# Patient Record
Sex: Female | Born: 1935
Health system: Southern US, Community
[De-identification: ages and names within clinical notes are randomized; demographics above are authoritative.]

## PROBLEM LIST (undated history)

## (undated) DIAGNOSIS — I4891 Unspecified atrial fibrillation: Secondary | ICD-10-CM

## (undated) DIAGNOSIS — I509 Heart failure, unspecified: Secondary | ICD-10-CM

## (undated) DIAGNOSIS — I351 Nonrheumatic aortic (valve) insufficiency: Secondary | ICD-10-CM

## (undated) DIAGNOSIS — I447 Left bundle-branch block, unspecified: Secondary | ICD-10-CM

## (undated) HISTORY — PX: BIV ICD GENERTAOR CHANGE OUT: SHX5745

## (undated) HISTORY — PX: OTHER SURGICAL HISTORY: SHX169

## (undated) HISTORY — PX: ABDOMINAL HYSTERECTOMY: SHX81

## (undated) HISTORY — DX: Unspecified atrial fibrillation: I48.91

## (undated) HISTORY — DX: Nonrheumatic aortic (valve) insufficiency: I35.1

## (undated) HISTORY — PX: GALLBLADDER SURGERY: SHX652

## (undated) HISTORY — DX: Left bundle-branch block, unspecified: I44.7

## (undated) HISTORY — DX: Heart failure, unspecified: I50.9

---

## 2011-04-05 DIAGNOSIS — R55 Syncope and collapse: Secondary | ICD-10-CM | POA: Diagnosis not present

## 2011-04-05 DIAGNOSIS — Z95810 Presence of automatic (implantable) cardiac defibrillator: Secondary | ICD-10-CM | POA: Diagnosis not present

## 2011-04-05 DIAGNOSIS — I428 Other cardiomyopathies: Secondary | ICD-10-CM | POA: Diagnosis not present

## 2011-04-12 DIAGNOSIS — I2589 Other forms of chronic ischemic heart disease: Secondary | ICD-10-CM | POA: Diagnosis not present

## 2011-05-25 DIAGNOSIS — Z95810 Presence of automatic (implantable) cardiac defibrillator: Secondary | ICD-10-CM | POA: Diagnosis not present

## 2011-05-25 DIAGNOSIS — R55 Syncope and collapse: Secondary | ICD-10-CM | POA: Diagnosis not present

## 2011-05-25 DIAGNOSIS — I428 Other cardiomyopathies: Secondary | ICD-10-CM | POA: Diagnosis not present

## 2011-05-25 DIAGNOSIS — Z79899 Other long term (current) drug therapy: Secondary | ICD-10-CM | POA: Diagnosis not present

## 2011-05-25 DIAGNOSIS — I10 Essential (primary) hypertension: Secondary | ICD-10-CM | POA: Diagnosis not present

## 2011-07-30 DIAGNOSIS — H524 Presbyopia: Secondary | ICD-10-CM | POA: Diagnosis not present

## 2011-07-30 DIAGNOSIS — H2510 Age-related nuclear cataract, unspecified eye: Secondary | ICD-10-CM | POA: Diagnosis not present

## 2011-08-09 DIAGNOSIS — I2589 Other forms of chronic ischemic heart disease: Secondary | ICD-10-CM | POA: Diagnosis not present

## 2011-08-09 DIAGNOSIS — Z45018 Encounter for adjustment and management of other part of cardiac pacemaker: Secondary | ICD-10-CM | POA: Diagnosis not present

## 2011-08-17 DIAGNOSIS — Z862 Personal history of diseases of the blood and blood-forming organs and certain disorders involving the immune mechanism: Secondary | ICD-10-CM | POA: Diagnosis not present

## 2011-09-08 DIAGNOSIS — H526 Other disorders of refraction: Secondary | ICD-10-CM | POA: Diagnosis not present

## 2011-09-08 DIAGNOSIS — H269 Unspecified cataract: Secondary | ICD-10-CM | POA: Diagnosis not present

## 2011-09-08 DIAGNOSIS — H40009 Preglaucoma, unspecified, unspecified eye: Secondary | ICD-10-CM | POA: Diagnosis not present

## 2011-10-18 DIAGNOSIS — I359 Nonrheumatic aortic valve disorder, unspecified: Secondary | ICD-10-CM | POA: Diagnosis not present

## 2011-10-18 DIAGNOSIS — Z95810 Presence of automatic (implantable) cardiac defibrillator: Secondary | ICD-10-CM | POA: Diagnosis not present

## 2011-10-18 DIAGNOSIS — Z7901 Long term (current) use of anticoagulants: Secondary | ICD-10-CM | POA: Diagnosis not present

## 2011-10-18 DIAGNOSIS — I428 Other cardiomyopathies: Secondary | ICD-10-CM | POA: Diagnosis not present

## 2011-10-18 DIAGNOSIS — I059 Rheumatic mitral valve disease, unspecified: Secondary | ICD-10-CM | POA: Diagnosis not present

## 2011-10-18 DIAGNOSIS — I4949 Other premature depolarization: Secondary | ICD-10-CM | POA: Diagnosis not present

## 2011-10-18 DIAGNOSIS — I420 Dilated cardiomyopathy: Secondary | ICD-10-CM | POA: Insufficient documentation

## 2011-10-18 DIAGNOSIS — Z880 Allergy status to penicillin: Secondary | ICD-10-CM | POA: Diagnosis not present

## 2011-10-18 DIAGNOSIS — I447 Left bundle-branch block, unspecified: Secondary | ICD-10-CM | POA: Diagnosis not present

## 2011-10-18 DIAGNOSIS — Z888 Allergy status to other drugs, medicaments and biological substances status: Secondary | ICD-10-CM | POA: Diagnosis not present

## 2011-10-18 DIAGNOSIS — Z79899 Other long term (current) drug therapy: Secondary | ICD-10-CM | POA: Diagnosis not present

## 2011-11-15 DIAGNOSIS — H40009 Preglaucoma, unspecified, unspecified eye: Secondary | ICD-10-CM | POA: Diagnosis not present

## 2011-11-15 DIAGNOSIS — H269 Unspecified cataract: Secondary | ICD-10-CM | POA: Diagnosis not present

## 2011-11-15 DIAGNOSIS — B900 Sequelae of central nervous system tuberculosis: Secondary | ICD-10-CM | POA: Diagnosis not present

## 2011-11-15 DIAGNOSIS — H5210 Myopia, unspecified eye: Secondary | ICD-10-CM | POA: Diagnosis not present

## 2011-11-26 DIAGNOSIS — I4891 Unspecified atrial fibrillation: Secondary | ICD-10-CM | POA: Diagnosis not present

## 2011-11-26 DIAGNOSIS — Z23 Encounter for immunization: Secondary | ICD-10-CM | POA: Diagnosis not present

## 2011-12-08 DIAGNOSIS — H40009 Preglaucoma, unspecified, unspecified eye: Secondary | ICD-10-CM | POA: Diagnosis not present

## 2011-12-08 DIAGNOSIS — I6789 Other cerebrovascular disease: Secondary | ICD-10-CM | POA: Diagnosis not present

## 2011-12-08 DIAGNOSIS — H269 Unspecified cataract: Secondary | ICD-10-CM | POA: Diagnosis not present

## 2011-12-14 DIAGNOSIS — I2589 Other forms of chronic ischemic heart disease: Secondary | ICD-10-CM | POA: Diagnosis not present

## 2012-03-03 DIAGNOSIS — Z7901 Long term (current) use of anticoagulants: Secondary | ICD-10-CM | POA: Diagnosis not present

## 2012-06-27 DIAGNOSIS — I428 Other cardiomyopathies: Secondary | ICD-10-CM | POA: Diagnosis not present

## 2012-06-27 DIAGNOSIS — Z7901 Long term (current) use of anticoagulants: Secondary | ICD-10-CM | POA: Diagnosis not present

## 2012-07-21 DIAGNOSIS — H2510 Age-related nuclear cataract, unspecified eye: Secondary | ICD-10-CM | POA: Diagnosis not present

## 2012-08-09 DIAGNOSIS — Z862 Personal history of diseases of the blood and blood-forming organs and certain disorders involving the immune mechanism: Secondary | ICD-10-CM | POA: Diagnosis not present

## 2012-08-09 DIAGNOSIS — Z95810 Presence of automatic (implantable) cardiac defibrillator: Secondary | ICD-10-CM | POA: Diagnosis not present

## 2012-08-09 DIAGNOSIS — Z7901 Long term (current) use of anticoagulants: Secondary | ICD-10-CM | POA: Diagnosis not present

## 2012-08-28 DIAGNOSIS — I2589 Other forms of chronic ischemic heart disease: Secondary | ICD-10-CM | POA: Diagnosis not present

## 2012-08-28 DIAGNOSIS — Z95810 Presence of automatic (implantable) cardiac defibrillator: Secondary | ICD-10-CM | POA: Diagnosis not present

## 2012-10-16 DIAGNOSIS — I428 Other cardiomyopathies: Secondary | ICD-10-CM | POA: Diagnosis not present

## 2012-10-16 DIAGNOSIS — I447 Left bundle-branch block, unspecified: Secondary | ICD-10-CM | POA: Diagnosis not present

## 2012-10-24 DIAGNOSIS — I10 Essential (primary) hypertension: Secondary | ICD-10-CM | POA: Diagnosis not present

## 2012-10-24 DIAGNOSIS — I428 Other cardiomyopathies: Secondary | ICD-10-CM | POA: Diagnosis not present

## 2012-10-24 DIAGNOSIS — Z23 Encounter for immunization: Secondary | ICD-10-CM | POA: Diagnosis not present

## 2012-10-24 DIAGNOSIS — Z7901 Long term (current) use of anticoagulants: Secondary | ICD-10-CM | POA: Diagnosis not present

## 2012-10-24 DIAGNOSIS — G4700 Insomnia, unspecified: Secondary | ICD-10-CM | POA: Diagnosis not present

## 2013-01-23 DIAGNOSIS — Z862 Personal history of diseases of the blood and blood-forming organs and certain disorders involving the immune mechanism: Secondary | ICD-10-CM | POA: Diagnosis not present

## 2013-01-23 DIAGNOSIS — I428 Other cardiomyopathies: Secondary | ICD-10-CM | POA: Diagnosis not present

## 2013-01-23 DIAGNOSIS — Z7901 Long term (current) use of anticoagulants: Secondary | ICD-10-CM | POA: Diagnosis not present

## 2013-01-30 DIAGNOSIS — I509 Heart failure, unspecified: Secondary | ICD-10-CM | POA: Diagnosis not present

## 2013-01-30 DIAGNOSIS — I517 Cardiomegaly: Secondary | ICD-10-CM | POA: Diagnosis not present

## 2013-01-30 DIAGNOSIS — I700 Atherosclerosis of aorta: Secondary | ICD-10-CM | POA: Diagnosis not present

## 2013-01-30 DIAGNOSIS — I447 Left bundle-branch block, unspecified: Secondary | ICD-10-CM | POA: Diagnosis not present

## 2013-01-30 DIAGNOSIS — I428 Other cardiomyopathies: Secondary | ICD-10-CM | POA: Diagnosis not present

## 2013-01-30 DIAGNOSIS — I4891 Unspecified atrial fibrillation: Secondary | ICD-10-CM | POA: Diagnosis not present

## 2013-01-30 DIAGNOSIS — I2589 Other forms of chronic ischemic heart disease: Secondary | ICD-10-CM | POA: Diagnosis not present

## 2013-01-30 DIAGNOSIS — I079 Rheumatic tricuspid valve disease, unspecified: Secondary | ICD-10-CM | POA: Diagnosis not present

## 2013-01-30 DIAGNOSIS — I059 Rheumatic mitral valve disease, unspecified: Secondary | ICD-10-CM | POA: Diagnosis not present

## 2013-01-30 DIAGNOSIS — Z95810 Presence of automatic (implantable) cardiac defibrillator: Secondary | ICD-10-CM | POA: Diagnosis not present

## 2013-01-30 LAB — CBC AND DIFFERENTIAL
HEMOGLOBIN: 12.2 g/dL (ref 12.0–16.0)
PLATELETS: 224 10*3/uL (ref 150–399)
WBC: 6.9 10*3/mL

## 2013-01-30 LAB — HEPATIC FUNCTION PANEL
ALT: 10 U/L (ref 7–35)
AST: 14 U/L (ref 13–35)

## 2013-01-30 LAB — BASIC METABOLIC PANEL
CREATININE: 1.2 mg/dL — AB (ref 0.5–1.1)
GLUCOSE: 85 mg/dL
Potassium: 4.1 mmol/L (ref 3.4–5.3)
Sodium: 137 mmol/L (ref 137–147)

## 2013-01-30 LAB — TSH: TSH: 2.8 u[IU]/mL (ref 0.41–5.90)

## 2013-01-31 DIAGNOSIS — I517 Cardiomegaly: Secondary | ICD-10-CM | POA: Diagnosis not present

## 2013-01-31 DIAGNOSIS — I509 Heart failure, unspecified: Secondary | ICD-10-CM | POA: Diagnosis not present

## 2013-01-31 DIAGNOSIS — I428 Other cardiomyopathies: Secondary | ICD-10-CM | POA: Diagnosis not present

## 2013-01-31 DIAGNOSIS — I447 Left bundle-branch block, unspecified: Secondary | ICD-10-CM | POA: Diagnosis not present

## 2013-01-31 DIAGNOSIS — I059 Rheumatic mitral valve disease, unspecified: Secondary | ICD-10-CM | POA: Diagnosis not present

## 2013-01-31 DIAGNOSIS — I4891 Unspecified atrial fibrillation: Secondary | ICD-10-CM | POA: Diagnosis not present

## 2013-02-07 DIAGNOSIS — I4891 Unspecified atrial fibrillation: Secondary | ICD-10-CM | POA: Diagnosis not present

## 2013-03-27 DIAGNOSIS — I2589 Other forms of chronic ischemic heart disease: Secondary | ICD-10-CM | POA: Diagnosis not present

## 2013-03-27 DIAGNOSIS — Z95810 Presence of automatic (implantable) cardiac defibrillator: Secondary | ICD-10-CM | POA: Diagnosis not present

## 2013-03-28 DIAGNOSIS — I369 Nonrheumatic tricuspid valve disorder, unspecified: Secondary | ICD-10-CM | POA: Diagnosis not present

## 2013-03-28 DIAGNOSIS — I447 Left bundle-branch block, unspecified: Secondary | ICD-10-CM | POA: Diagnosis not present

## 2013-03-28 DIAGNOSIS — I509 Heart failure, unspecified: Secondary | ICD-10-CM | POA: Diagnosis not present

## 2013-03-28 DIAGNOSIS — Z888 Allergy status to other drugs, medicaments and biological substances status: Secondary | ICD-10-CM | POA: Diagnosis not present

## 2013-03-28 DIAGNOSIS — Z880 Allergy status to penicillin: Secondary | ICD-10-CM | POA: Diagnosis not present

## 2013-03-28 DIAGNOSIS — I4891 Unspecified atrial fibrillation: Secondary | ICD-10-CM | POA: Diagnosis not present

## 2013-03-28 DIAGNOSIS — I080 Rheumatic disorders of both mitral and aortic valves: Secondary | ICD-10-CM | POA: Diagnosis not present

## 2013-03-28 DIAGNOSIS — I4892 Unspecified atrial flutter: Secondary | ICD-10-CM | POA: Diagnosis not present

## 2013-03-28 DIAGNOSIS — I428 Other cardiomyopathies: Secondary | ICD-10-CM | POA: Diagnosis not present

## 2013-04-04 DIAGNOSIS — I447 Left bundle-branch block, unspecified: Secondary | ICD-10-CM | POA: Diagnosis not present

## 2013-04-04 DIAGNOSIS — I059 Rheumatic mitral valve disease, unspecified: Secondary | ICD-10-CM | POA: Diagnosis not present

## 2013-04-04 DIAGNOSIS — I4891 Unspecified atrial fibrillation: Secondary | ICD-10-CM | POA: Diagnosis not present

## 2013-04-04 DIAGNOSIS — I359 Nonrheumatic aortic valve disorder, unspecified: Secondary | ICD-10-CM | POA: Diagnosis not present

## 2013-04-04 DIAGNOSIS — Z95810 Presence of automatic (implantable) cardiac defibrillator: Secondary | ICD-10-CM | POA: Diagnosis not present

## 2013-04-04 DIAGNOSIS — I428 Other cardiomyopathies: Secondary | ICD-10-CM | POA: Diagnosis not present

## 2013-05-22 DIAGNOSIS — I059 Rheumatic mitral valve disease, unspecified: Secondary | ICD-10-CM | POA: Diagnosis not present

## 2013-05-22 DIAGNOSIS — I4891 Unspecified atrial fibrillation: Secondary | ICD-10-CM | POA: Diagnosis not present

## 2013-05-22 DIAGNOSIS — I428 Other cardiomyopathies: Secondary | ICD-10-CM | POA: Diagnosis not present

## 2013-05-22 DIAGNOSIS — Z9889 Other specified postprocedural states: Secondary | ICD-10-CM | POA: Diagnosis not present

## 2013-05-22 DIAGNOSIS — I359 Nonrheumatic aortic valve disorder, unspecified: Secondary | ICD-10-CM | POA: Diagnosis not present

## 2013-05-23 DIAGNOSIS — Z7901 Long term (current) use of anticoagulants: Secondary | ICD-10-CM | POA: Diagnosis not present

## 2013-05-23 DIAGNOSIS — Z79899 Other long term (current) drug therapy: Secondary | ICD-10-CM | POA: Diagnosis not present

## 2013-05-23 DIAGNOSIS — I4891 Unspecified atrial fibrillation: Secondary | ICD-10-CM | POA: Diagnosis not present

## 2013-05-23 DIAGNOSIS — I428 Other cardiomyopathies: Secondary | ICD-10-CM | POA: Diagnosis not present

## 2013-05-23 DIAGNOSIS — I10 Essential (primary) hypertension: Secondary | ICD-10-CM | POA: Diagnosis not present

## 2013-05-28 DIAGNOSIS — I4891 Unspecified atrial fibrillation: Secondary | ICD-10-CM | POA: Diagnosis not present

## 2013-05-28 DIAGNOSIS — I5022 Chronic systolic (congestive) heart failure: Secondary | ICD-10-CM | POA: Diagnosis not present

## 2013-05-28 DIAGNOSIS — I428 Other cardiomyopathies: Secondary | ICD-10-CM | POA: Diagnosis not present

## 2013-05-28 DIAGNOSIS — Z95810 Presence of automatic (implantable) cardiac defibrillator: Secondary | ICD-10-CM | POA: Diagnosis not present

## 2013-06-22 DIAGNOSIS — I4891 Unspecified atrial fibrillation: Secondary | ICD-10-CM | POA: Diagnosis not present

## 2013-06-22 DIAGNOSIS — I428 Other cardiomyopathies: Secondary | ICD-10-CM | POA: Diagnosis not present

## 2013-07-05 DIAGNOSIS — I2589 Other forms of chronic ischemic heart disease: Secondary | ICD-10-CM | POA: Diagnosis not present

## 2013-07-05 DIAGNOSIS — Z45018 Encounter for adjustment and management of other part of cardiac pacemaker: Secondary | ICD-10-CM | POA: Diagnosis not present

## 2013-07-05 DIAGNOSIS — Z95810 Presence of automatic (implantable) cardiac defibrillator: Secondary | ICD-10-CM | POA: Diagnosis not present

## 2013-09-14 DIAGNOSIS — H2510 Age-related nuclear cataract, unspecified eye: Secondary | ICD-10-CM | POA: Diagnosis not present

## 2013-09-14 DIAGNOSIS — H524 Presbyopia: Secondary | ICD-10-CM | POA: Diagnosis not present

## 2013-10-08 DIAGNOSIS — Z4509 Encounter for adjustment and management of other cardiac device: Secondary | ICD-10-CM | POA: Diagnosis not present

## 2013-10-08 DIAGNOSIS — Z95818 Presence of other cardiac implants and grafts: Secondary | ICD-10-CM | POA: Diagnosis not present

## 2013-10-08 DIAGNOSIS — Z4502 Encounter for adjustment and management of automatic implantable cardiac defibrillator: Secondary | ICD-10-CM | POA: Diagnosis not present

## 2013-10-08 DIAGNOSIS — I2589 Other forms of chronic ischemic heart disease: Secondary | ICD-10-CM | POA: Diagnosis not present

## 2013-10-08 DIAGNOSIS — Z95810 Presence of automatic (implantable) cardiac defibrillator: Secondary | ICD-10-CM | POA: Diagnosis not present

## 2013-11-12 DIAGNOSIS — Z95818 Presence of other cardiac implants and grafts: Secondary | ICD-10-CM | POA: Diagnosis not present

## 2013-11-12 DIAGNOSIS — I2589 Other forms of chronic ischemic heart disease: Secondary | ICD-10-CM | POA: Diagnosis not present

## 2013-11-12 DIAGNOSIS — Z95810 Presence of automatic (implantable) cardiac defibrillator: Secondary | ICD-10-CM | POA: Diagnosis not present

## 2013-11-27 DIAGNOSIS — Z95810 Presence of automatic (implantable) cardiac defibrillator: Secondary | ICD-10-CM | POA: Diagnosis not present

## 2013-11-27 DIAGNOSIS — I428 Other cardiomyopathies: Secondary | ICD-10-CM | POA: Diagnosis not present

## 2013-11-27 DIAGNOSIS — I059 Rheumatic mitral valve disease, unspecified: Secondary | ICD-10-CM | POA: Diagnosis not present

## 2013-11-27 DIAGNOSIS — I359 Nonrheumatic aortic valve disorder, unspecified: Secondary | ICD-10-CM | POA: Diagnosis not present

## 2013-12-06 DIAGNOSIS — Z23 Encounter for immunization: Secondary | ICD-10-CM | POA: Diagnosis not present

## 2014-01-03 DIAGNOSIS — I447 Left bundle-branch block, unspecified: Secondary | ICD-10-CM | POA: Diagnosis not present

## 2014-01-03 DIAGNOSIS — I420 Dilated cardiomyopathy: Secondary | ICD-10-CM | POA: Diagnosis not present

## 2014-01-03 DIAGNOSIS — I5022 Chronic systolic (congestive) heart failure: Secondary | ICD-10-CM | POA: Diagnosis not present

## 2014-01-03 DIAGNOSIS — I471 Supraventricular tachycardia: Secondary | ICD-10-CM | POA: Diagnosis not present

## 2014-01-03 DIAGNOSIS — Z7901 Long term (current) use of anticoagulants: Secondary | ICD-10-CM | POA: Diagnosis not present

## 2014-01-03 DIAGNOSIS — I480 Paroxysmal atrial fibrillation: Secondary | ICD-10-CM | POA: Diagnosis not present

## 2014-02-13 DIAGNOSIS — Z45018 Encounter for adjustment and management of other part of cardiac pacemaker: Secondary | ICD-10-CM | POA: Diagnosis not present

## 2014-02-13 DIAGNOSIS — Z95810 Presence of automatic (implantable) cardiac defibrillator: Secondary | ICD-10-CM | POA: Diagnosis not present

## 2014-02-13 DIAGNOSIS — Z95818 Presence of other cardiac implants and grafts: Secondary | ICD-10-CM | POA: Diagnosis not present

## 2014-02-13 DIAGNOSIS — I255 Ischemic cardiomyopathy: Secondary | ICD-10-CM | POA: Diagnosis not present

## 2014-05-15 DIAGNOSIS — I5022 Chronic systolic (congestive) heart failure: Secondary | ICD-10-CM | POA: Diagnosis not present

## 2014-05-15 DIAGNOSIS — Z95818 Presence of other cardiac implants and grafts: Secondary | ICD-10-CM | POA: Diagnosis not present

## 2014-05-15 DIAGNOSIS — Z95810 Presence of automatic (implantable) cardiac defibrillator: Secondary | ICD-10-CM | POA: Diagnosis not present

## 2014-05-28 DIAGNOSIS — I340 Nonrheumatic mitral (valve) insufficiency: Secondary | ICD-10-CM | POA: Diagnosis not present

## 2014-05-28 DIAGNOSIS — Z7901 Long term (current) use of anticoagulants: Secondary | ICD-10-CM | POA: Diagnosis not present

## 2014-05-28 DIAGNOSIS — I5022 Chronic systolic (congestive) heart failure: Secondary | ICD-10-CM | POA: Diagnosis not present

## 2014-05-28 DIAGNOSIS — I351 Nonrheumatic aortic (valve) insufficiency: Secondary | ICD-10-CM | POA: Diagnosis not present

## 2014-05-28 DIAGNOSIS — I420 Dilated cardiomyopathy: Secondary | ICD-10-CM | POA: Diagnosis not present

## 2014-06-05 ENCOUNTER — Encounter: Payer: Self-pay | Admitting: Family Medicine

## 2014-06-05 ENCOUNTER — Ambulatory Visit (INDEPENDENT_AMBULATORY_CARE_PROVIDER_SITE_OTHER): Payer: Medicare Other | Admitting: Family Medicine

## 2014-06-05 VITALS — BP 137/87 | HR 62 | Ht 66.0 in | Wt 173.0 lb

## 2014-06-05 DIAGNOSIS — Z780 Asymptomatic menopausal state: Secondary | ICD-10-CM

## 2014-06-05 DIAGNOSIS — I447 Left bundle-branch block, unspecified: Secondary | ICD-10-CM

## 2014-06-05 DIAGNOSIS — I351 Nonrheumatic aortic (valve) insufficiency: Secondary | ICD-10-CM

## 2014-06-05 DIAGNOSIS — I482 Chronic atrial fibrillation, unspecified: Secondary | ICD-10-CM

## 2014-06-05 DIAGNOSIS — I509 Heart failure, unspecified: Secondary | ICD-10-CM | POA: Diagnosis not present

## 2014-06-05 DIAGNOSIS — Z950 Presence of cardiac pacemaker: Secondary | ICD-10-CM

## 2014-06-05 DIAGNOSIS — I4891 Unspecified atrial fibrillation: Secondary | ICD-10-CM

## 2014-06-05 HISTORY — DX: Heart failure, unspecified: I50.9

## 2014-06-05 HISTORY — DX: Left bundle-branch block, unspecified: I44.7

## 2014-06-05 HISTORY — DX: Unspecified atrial fibrillation: I48.91

## 2014-06-05 HISTORY — DX: Nonrheumatic aortic (valve) insufficiency: I35.1

## 2014-06-05 NOTE — Progress Notes (Signed)
CC: Lindsey Reynolds is a 81 y.o. female is here for Establish Care   Subjective: HPI:  Very pleasant 81 year old here to establish care  She tells me she was diagnosed with atrial fibrillation a unknown amount of years ago. She's required cardioversion twice. She is currently taking carvediloland xarelto with any rapid heartbeat, irregular heartbeat nor bleeding or bruising abnormalities.  She has a history of left bundle branch block and congestive heart failure and currently has a 3-lead cardiac pacemaker/defibrillator that is managed by [facility]. She takes carvedilol and lisinopril. She was intolerant of lisinopril or carvedilol was at a dose of 12 mg twice a day however when this was decreased she was now able to tolerate 2.5 mg of lisinopril daily. She denies any new lightheadedness or orthostatic like symptoms.  She tells me that ever since menopause she has taken oral estrogen. She has had a hysterectomy for benign reasons. She denies any vaginal bleeding nor history of abnormal mammograms. She tells me she understands that there is an increased risk of breast cancer and ovarian cancer while taking estrogen but she is willing to accept this risk because the estrogen helps with quality of life in regards to energy, cognition and overall sense of well-being.  Review of Systems - General ROS: negative for - chills, fever, night sweats, weight gain or weight loss Ophthalmic ROS: negative for - decreased vision Psychological ROS: negative for - anxiety or depression ENT ROS: negative for - hearing change, nasal congestion, tinnitus or allergies Hematological and Lymphatic ROS: negative for - bleeding problems, bruising or swollen lymph nodes Breast ROS: negative Respiratory ROS: no cough, shortness of breath, or wheezing Cardiovascular ROS: no chest pain or dyspnea on exertion Gastrointestinal ROS: no abdominal pain, change in bowel habits, or black or bloody  stools Genito-Urinary ROS: negative for - genital discharge, genital ulcers, incontinence or abnormal bleeding from genitals Musculoskeletal ROS: negative for - joint pain or muscle pain Neurological ROS: negative for - headaches or memory loss Dermatological ROS: negative for lumps, mole changes, rash and skin lesion changes  Past Medical History  Diagnosis Date  . Atrial fibrillation 06/05/2014  . Congestive heart failure 06/05/2014  . LBBB (left bundle branch block) 06/05/2014  . Aortic valve regurgitation 06/05/2014    Past Surgical History  Procedure Laterality Date  . Abdominal hysterectomy     History reviewed. No pertinent family history.  History   Social History  . Marital Status: Married    Spouse Name: N/A  . Number of Children: N/A  . Years of Education: N/A   Occupational History  . Not on file.   Social History Main Topics  . Smoking status: Not on file  . Smokeless tobacco: Not on file  . Alcohol Use: Not on file  . Drug Use: Not on file  . Sexual Activity: Not on file   Other Topics Concern  . Not on file   Social History Narrative  . No narrative on file     Objective: BP 137/87 mmHg  Pulse 62  Ht 5\' 6"  (1.676 m)  Wt 173 lb (78.472 kg)  BMI 27.94 kg/m2  General: Alert and Oriented, No Acute Distress HEENT: Pupils equal, round, reactive to light. Conjunctivae clear.  Moist mucous membranes Lungs: Clear to auscultation bilaterally, no wheezing/ronchi/rales.  Comfortable work of breathing. Good air movement. Cardiac: Regular rate and rhythm. Normal S1/S2.  No murmurs, rubs, nor gallops.   Extremities: No peripheral edema.  Strong peripheral pulses.  Mental Status: No depression, anxiety, nor agitation. Skin: Warm and dry.  Assessment & Plan: Lindsey Reynolds was seen today for establish care.  Diagnoses and all orders for this visit:  Chronic atrial fibrillation  Aortic valve regurgitation  LBBB (left bundle branch block)  Congestive heart failure,  unspecified congestive heart failure chronicity, unspecified congestive heart failure type  Cardiac pacemaker in situ  Menopause   Chronic atrial fibrillation: Controlled, continue carvedilol and flecinide and xarelto Congestive heart failure: Controlled continue beta blocker and ACE inhibitor Menopause: Again she understands the risk that estrogen may increase the risk of certain cancers like breast cancer and ovarian cancer however she's willing to accept this risk, I would be willing to provide refills when she is needing this 3 times a week estrogen supplement.   Return in about 6 months (around 12/05/2014).

## 2014-06-12 DIAGNOSIS — I5022 Chronic systolic (congestive) heart failure: Secondary | ICD-10-CM | POA: Diagnosis not present

## 2014-06-13 ENCOUNTER — Encounter: Payer: Self-pay | Admitting: Family Medicine

## 2014-06-13 ENCOUNTER — Telehealth: Payer: Self-pay | Admitting: Family Medicine

## 2014-06-13 DIAGNOSIS — I340 Nonrheumatic mitral (valve) insufficiency: Secondary | ICD-10-CM | POA: Insufficient documentation

## 2014-06-13 NOTE — Telephone Encounter (Signed)
Care everywhere 

## 2014-06-14 ENCOUNTER — Encounter: Payer: Self-pay | Admitting: Family Medicine

## 2014-07-11 DIAGNOSIS — Z95818 Presence of other cardiac implants and grafts: Secondary | ICD-10-CM | POA: Diagnosis not present

## 2014-07-11 DIAGNOSIS — Z95810 Presence of automatic (implantable) cardiac defibrillator: Secondary | ICD-10-CM | POA: Diagnosis not present

## 2014-07-11 DIAGNOSIS — I255 Ischemic cardiomyopathy: Secondary | ICD-10-CM | POA: Diagnosis not present

## 2014-08-16 DIAGNOSIS — H25011 Cortical age-related cataract, right eye: Secondary | ICD-10-CM | POA: Diagnosis not present

## 2014-08-16 DIAGNOSIS — H2511 Age-related nuclear cataract, right eye: Secondary | ICD-10-CM | POA: Diagnosis not present

## 2014-08-16 DIAGNOSIS — H2512 Age-related nuclear cataract, left eye: Secondary | ICD-10-CM | POA: Diagnosis not present

## 2014-08-16 DIAGNOSIS — H25012 Cortical age-related cataract, left eye: Secondary | ICD-10-CM | POA: Diagnosis not present

## 2014-09-03 ENCOUNTER — Other Ambulatory Visit: Payer: Self-pay

## 2014-09-03 ENCOUNTER — Telehealth: Payer: Self-pay | Admitting: Family Medicine

## 2014-09-03 MED ORDER — ESTROGENS CONJUGATED 0.625 MG PO TABS: ORAL_TABLET | ORAL | Status: DC

## 2014-09-03 NOTE — Telephone Encounter (Signed)
Medication sent to pharmacy  

## 2014-09-03 NOTE — Telephone Encounter (Signed)
Patient needs refill sent to [facility] in [place] on [address] for Premarin 0.625 mg  thanks

## 2014-09-11 ENCOUNTER — Encounter: Payer: Self-pay | Admitting: Family Medicine

## 2014-09-11 ENCOUNTER — Ambulatory Visit (INDEPENDENT_AMBULATORY_CARE_PROVIDER_SITE_OTHER): Payer: Medicare Other | Admitting: Family Medicine

## 2014-09-11 VITALS — BP 159/91 | HR 75 | Wt 175.0 lb

## 2014-09-11 DIAGNOSIS — I482 Chronic atrial fibrillation, unspecified: Secondary | ICD-10-CM

## 2014-09-11 DIAGNOSIS — I10 Essential (primary) hypertension: Secondary | ICD-10-CM | POA: Diagnosis not present

## 2014-09-11 DIAGNOSIS — Z780 Asymptomatic menopausal state: Secondary | ICD-10-CM | POA: Diagnosis not present

## 2014-09-11 MED ORDER — CARVEDILOL 6.25 MG PO TABS: 6.2500 mg | ORAL_TABLET | Freq: Two times a day (BID) | ORAL | Status: DC

## 2014-09-11 MED ORDER — ESTROGENS CONJUGATED 0.625 MG PO TABS: ORAL_TABLET | ORAL | Status: DC

## 2014-09-11 NOTE — Progress Notes (Signed)
CC: Lindsey Reynolds is a 81 y.o. female is here for f/u estrogen   Subjective: HPI:  Follow-up menopause: She continues to take estrogen on a daily basis she tried to stop this in the past however has fatigue, depression, and overall severe decrease in quality of life. She would like to know if she can keep taking this medication.   Follow-up atrial fibrillation: She denies any irregular heartbeat, edema, shortness of breath or chest pain. She continues to take carvedilol and flecainide on a daily basis. She denies any fatigue.  Follow essential hypertension: No outside blood pressures to report. She tells me that any dose of lisinopril even when taking only 2.5 mg daily she was having symptoms about hypotension and close to falling. Ever since stopping this medication she's had no issues whatsoever but also that she is only taking half a dose of carvedilol twice a day. No motor or sensory disturbances since stopping lisinopril     Review Of Systems Outlined In HPI  Past Medical History  Diagnosis Date  . Atrial fibrillation 06/05/2014  . Congestive heart failure 06/05/2014  . LBBB (left bundle branch block) 06/05/2014  . Aortic valve regurgitation 06/05/2014    Past Surgical History  Procedure Laterality Date  . Abdominal hysterectomy    . Gallbladder surgery      20 years ago  . Cardoversion      x 2 2015  . Placement of defibrillator      2012  . Biv icd genertaor change out     No family history on file.  History   Social History  . Marital Status: Married    Spouse Name: N/A  . Number of Children: N/A  . Years of Education: N/A   Occupational History  . Not on file.   Social History Main Topics  . Smoking status: Never Smoker   . Smokeless tobacco: Not on file  . Alcohol Use: 0.0 oz/week    0 Standard drinks or equivalent per week  . Drug Use: No  . Sexual Activity:    Partners: Male   Other Topics Concern  . Not on file   Social History Narrative  . No  narrative on file     Objective: BP 159/91 mmHg  Pulse 75  Wt 175 lb (79.379 kg)  General: Alert and Oriented, No Acute Distress HEENT: Pupils equal, round, reactive to light. Conjunctivae clear.   Lungs: Clear to auscultation bilaterally, no wheezing/ronchi/rales.  Comfortable work of breathing. Good air movement. Cardiac: Regular rate and rhythm. Normal S1/S2.  No murmurs, rubs, nor gallops.   Extremities: No peripheral edema.  Strong peripheral pulses.  Mental Status: No depression, anxiety, nor agitation. Skin: Warm and dry.  Assessment & Plan: Lindsey Reynolds was seen today for f/u estrogen.  Diagnoses and all orders for this visit:  Chronic atrial fibrillation Orders: -     carvedilol (COREG) 6.25 MG tablet; Take 1 tablet (6.25 mg total) by mouth 2 (two) times daily with a meal.  Menopause Orders: -     estrogens, conjugated, (PREMARIN) 0.625 MG tablet; One by mouth daily  Essential hypertension  Other orders -     Discontinue: carvedilol (COREG) 6.25 MG tablet; Take 1 tablet (6.25 mg total) by mouth 2 (two) times daily with a meal.   Essential hypertension: Controlled, I've asked her to consider increasing her carvedilol to 6.25 mg twice a day since her blood pressure is uncontrolled today. However understand if she needs to cut back to  only half the dose daily if she gets orthostatic. She'll follow up with her cardiologist between now and 6 months follow-up. Menopause: Discussed that there is some risk of ovarian or breast cancer with taking estrogen on a daily basis however she believes that the benefits outweigh the risks. Atrial fibrillation: Controlled, in regular rhythm today, continue xarelto and flecainide.  Return in about 6 months (around 03/14/2015).

## 2014-10-10 DIAGNOSIS — H2511 Age-related nuclear cataract, right eye: Secondary | ICD-10-CM | POA: Diagnosis not present

## 2014-10-10 DIAGNOSIS — H25811 Combined forms of age-related cataract, right eye: Secondary | ICD-10-CM | POA: Diagnosis not present

## 2014-10-11 DIAGNOSIS — H2512 Age-related nuclear cataract, left eye: Secondary | ICD-10-CM | POA: Diagnosis not present

## 2014-10-15 DIAGNOSIS — I255 Ischemic cardiomyopathy: Secondary | ICD-10-CM | POA: Diagnosis not present

## 2014-10-15 DIAGNOSIS — Z95818 Presence of other cardiac implants and grafts: Secondary | ICD-10-CM | POA: Diagnosis not present

## 2014-10-15 DIAGNOSIS — Z95810 Presence of automatic (implantable) cardiac defibrillator: Secondary | ICD-10-CM | POA: Diagnosis not present

## 2014-10-31 DIAGNOSIS — H2512 Age-related nuclear cataract, left eye: Secondary | ICD-10-CM | POA: Diagnosis not present

## 2014-11-27 DIAGNOSIS — Z23 Encounter for immunization: Secondary | ICD-10-CM | POA: Diagnosis not present

## 2014-12-03 DIAGNOSIS — I351 Nonrheumatic aortic (valve) insufficiency: Secondary | ICD-10-CM | POA: Diagnosis not present

## 2014-12-03 DIAGNOSIS — K219 Gastro-esophageal reflux disease without esophagitis: Secondary | ICD-10-CM | POA: Diagnosis not present

## 2014-12-03 DIAGNOSIS — Z79899 Other long term (current) drug therapy: Secondary | ICD-10-CM | POA: Diagnosis not present

## 2014-12-03 DIAGNOSIS — I480 Paroxysmal atrial fibrillation: Secondary | ICD-10-CM | POA: Diagnosis not present

## 2014-12-03 DIAGNOSIS — I420 Dilated cardiomyopathy: Secondary | ICD-10-CM | POA: Diagnosis not present

## 2014-12-03 DIAGNOSIS — Z8679 Personal history of other diseases of the circulatory system: Secondary | ICD-10-CM | POA: Diagnosis not present

## 2014-12-03 DIAGNOSIS — Z95810 Presence of automatic (implantable) cardiac defibrillator: Secondary | ICD-10-CM | POA: Insufficient documentation

## 2014-12-03 DIAGNOSIS — Z7901 Long term (current) use of anticoagulants: Secondary | ICD-10-CM | POA: Diagnosis not present

## 2014-12-03 DIAGNOSIS — I447 Left bundle-branch block, unspecified: Secondary | ICD-10-CM | POA: Diagnosis not present

## 2014-12-03 DIAGNOSIS — I340 Nonrheumatic mitral (valve) insufficiency: Secondary | ICD-10-CM | POA: Diagnosis not present

## 2014-12-03 DIAGNOSIS — R0602 Shortness of breath: Secondary | ICD-10-CM | POA: Diagnosis not present

## 2014-12-03 DIAGNOSIS — R05 Cough: Secondary | ICD-10-CM | POA: Diagnosis not present

## 2014-12-03 DIAGNOSIS — I080 Rheumatic disorders of both mitral and aortic valves: Secondary | ICD-10-CM | POA: Diagnosis not present

## 2015-01-09 DIAGNOSIS — I447 Left bundle-branch block, unspecified: Secondary | ICD-10-CM | POA: Diagnosis not present

## 2015-01-09 DIAGNOSIS — Z95810 Presence of automatic (implantable) cardiac defibrillator: Secondary | ICD-10-CM | POA: Diagnosis not present

## 2015-01-09 DIAGNOSIS — Z7901 Long term (current) use of anticoagulants: Secondary | ICD-10-CM | POA: Diagnosis not present

## 2015-01-09 DIAGNOSIS — I471 Supraventricular tachycardia: Secondary | ICD-10-CM | POA: Diagnosis not present

## 2015-01-09 DIAGNOSIS — I480 Paroxysmal atrial fibrillation: Secondary | ICD-10-CM | POA: Diagnosis not present

## 2015-01-09 DIAGNOSIS — I420 Dilated cardiomyopathy: Secondary | ICD-10-CM | POA: Diagnosis not present

## 2015-01-09 DIAGNOSIS — Z79899 Other long term (current) drug therapy: Secondary | ICD-10-CM | POA: Diagnosis not present

## 2015-01-20 DIAGNOSIS — I255 Ischemic cardiomyopathy: Secondary | ICD-10-CM | POA: Diagnosis not present

## 2015-01-20 DIAGNOSIS — Z95818 Presence of other cardiac implants and grafts: Secondary | ICD-10-CM | POA: Diagnosis not present

## 2015-01-20 DIAGNOSIS — Z95810 Presence of automatic (implantable) cardiac defibrillator: Secondary | ICD-10-CM | POA: Diagnosis not present

## 2015-03-19 ENCOUNTER — Ambulatory Visit (INDEPENDENT_AMBULATORY_CARE_PROVIDER_SITE_OTHER): Payer: Medicare Other | Admitting: Family Medicine

## 2015-03-19 ENCOUNTER — Encounter: Payer: Self-pay | Admitting: Family Medicine

## 2015-03-19 VITALS — BP 169/94 | HR 64 | Wt 175.0 lb

## 2015-03-19 DIAGNOSIS — Z780 Asymptomatic menopausal state: Secondary | ICD-10-CM

## 2015-03-19 DIAGNOSIS — I482 Chronic atrial fibrillation, unspecified: Secondary | ICD-10-CM

## 2015-03-19 DIAGNOSIS — I10 Essential (primary) hypertension: Secondary | ICD-10-CM | POA: Diagnosis not present

## 2015-03-19 DIAGNOSIS — Z23 Encounter for immunization: Secondary | ICD-10-CM | POA: Diagnosis not present

## 2015-03-19 MED ORDER — ESTROGENS CONJUGATED 0.625 MG PO TABS: ORAL_TABLET | ORAL | Status: DC

## 2015-03-19 MED ORDER — CARVEDILOL 12.5 MG PO TABS: 12.5000 mg | ORAL_TABLET | Freq: Two times a day (BID) | ORAL | Status: DC

## 2015-03-19 MED ORDER — PNEUMOCOCCAL 13-VAL CONJ VACC IM SUSP: 0.5000 mL | INTRAMUSCULAR | Status: DC

## 2015-03-19 NOTE — Progress Notes (Signed)
CC: Lindsey Reynolds is a 82 y.o. female is here for Hypertension   Subjective: HPI:  Follow-up atrial fibrillation: Follow-up atrial fibrillation: Taking Xarelto daily without known side effects. Denies racing heartbeat or new motor or sensory disturbances.  She is requesting refills on estrogen. She had a hysterectomy for noncancerous reasons. When she stops this medication she feels extreme fatigue and depression. She denies any known side effects. No vaginal bleeding  Follow-up essential hypertension: No outside blood pressure report. Taking carvedilol without known side effects. No chest pain shortness of breath orthopnea nor peripheral edema.   Review Of Systems Outlined In HPI  Past Medical History  Diagnosis Date  . Atrial fibrillation (HCC) 06/05/2014  . Congestive heart failure (HCC) 06/05/2014  . LBBB (left bundle branch block) 06/05/2014  . Aortic valve regurgitation 06/05/2014    Past Surgical History  Procedure Laterality Date  . Abdominal hysterectomy    . Gallbladder surgery      20 years ago  . Cardoversion      x 2 2015  . Placement of defibrillator      2012  . Biv icd genertaor change out     No family history on file.  Social History   Social History  . Marital Status: Married    Spouse Name: N/A  . Number of Children: N/A  . Years of Education: N/A   Occupational History  . Not on file.   Social History Main Topics  . Smoking status: Never Smoker   . Smokeless tobacco: Not on file  . Alcohol Use: 0.0 oz/week    0 Standard drinks or equivalent per week  . Drug Use: No  . Sexual Activity:    Partners: Male   Other Topics Concern  . Not on file   Social History Narrative     Objective: BP 169/94 mmHg  Pulse 64  Wt 175 lb (79.379 kg)  General: Alert and Oriented, No Acute Distress HEENT: Pupils equal, round, reactive to light. Conjunctivae clear.  Moist mucous membranes Lungs: Clear to auscultation bilaterally, no wheezing/ronchi/rales.   Comfortable work of breathing. Good air movement. Cardiac: Regular rate and rhythm. Normal S1/S2.  No murmurs, rubs, nor gallops.   Extremities: No peripheral edema.  Strong peripheral pulses.  Mental Status: No depression, anxiety, nor agitation. Skin: Warm and dry.  Assessment & Plan: Adelie was seen today for hypertension.  Diagnoses and all orders for this visit:  Chronic atrial fibrillation (HCC)  Menopause -     estrogens, conjugated, (PREMARIN) 0.625 MG tablet; One by mouth daily  Essential hypertension  Need for pneumococcal vaccination -     pneumococcal 13-valent conjugate vaccine (PREVNAR 13) injection 0.5 mL; Inject 0.5 mLs into the muscle tomorrow at 10 am.  Other orders -     carvedilol (COREG) 12.5 MG tablet; Take 1 tablet (12.5 mg total) by mouth 2 (two) times daily with a meal.   Atrial fibrillation: She sounds to be in sinus rhythm right now, continue xarelto follow-up with cardiology asAlready planned. Essential hypertension: Uncontrolled chronic condition increasing carvedilol Menopause: Refills on estrogen.  She received the Prevnar vaccine today and already had pneumococcal vaccine when she was 67.   Return in about 3 months (around 06/17/2015) for Blood Pressure.

## 2015-03-19 NOTE — Addendum Note (Signed)
Addended by: Thom Chimes on: 03/19/2015 01:50 PM   Modules accepted: Orders

## 2015-04-07 DIAGNOSIS — I255 Ischemic cardiomyopathy: Secondary | ICD-10-CM | POA: Diagnosis not present

## 2015-04-07 DIAGNOSIS — Z95818 Presence of other cardiac implants and grafts: Secondary | ICD-10-CM | POA: Diagnosis not present

## 2015-04-07 DIAGNOSIS — I4892 Unspecified atrial flutter: Secondary | ICD-10-CM | POA: Diagnosis not present

## 2015-04-07 DIAGNOSIS — Z95810 Presence of automatic (implantable) cardiac defibrillator: Secondary | ICD-10-CM | POA: Diagnosis not present

## 2015-05-01 DIAGNOSIS — Z95810 Presence of automatic (implantable) cardiac defibrillator: Secondary | ICD-10-CM | POA: Diagnosis not present

## 2015-05-01 DIAGNOSIS — I4891 Unspecified atrial fibrillation: Secondary | ICD-10-CM | POA: Diagnosis not present

## 2015-05-01 DIAGNOSIS — Z7901 Long term (current) use of anticoagulants: Secondary | ICD-10-CM | POA: Diagnosis not present

## 2015-05-01 DIAGNOSIS — I480 Paroxysmal atrial fibrillation: Secondary | ICD-10-CM | POA: Diagnosis not present

## 2015-05-01 DIAGNOSIS — I447 Left bundle-branch block, unspecified: Secondary | ICD-10-CM | POA: Diagnosis not present

## 2015-05-01 DIAGNOSIS — I420 Dilated cardiomyopathy: Secondary | ICD-10-CM | POA: Diagnosis not present

## 2015-05-15 DIAGNOSIS — R948 Abnormal results of function studies of other organs and systems: Secondary | ICD-10-CM | POA: Diagnosis not present

## 2015-05-15 DIAGNOSIS — I083 Combined rheumatic disorders of mitral, aortic and tricuspid valves: Secondary | ICD-10-CM | POA: Diagnosis not present

## 2015-05-15 DIAGNOSIS — I480 Paroxysmal atrial fibrillation: Secondary | ICD-10-CM | POA: Diagnosis not present

## 2015-05-15 DIAGNOSIS — I272 Other secondary pulmonary hypertension: Secondary | ICD-10-CM | POA: Diagnosis not present

## 2015-06-17 DIAGNOSIS — R0609 Other forms of dyspnea: Secondary | ICD-10-CM | POA: Diagnosis not present

## 2015-06-17 DIAGNOSIS — I340 Nonrheumatic mitral (valve) insufficiency: Secondary | ICD-10-CM | POA: Diagnosis not present

## 2015-06-17 DIAGNOSIS — I10 Essential (primary) hypertension: Secondary | ICD-10-CM | POA: Diagnosis not present

## 2015-06-17 DIAGNOSIS — Z7901 Long term (current) use of anticoagulants: Secondary | ICD-10-CM | POA: Diagnosis not present

## 2015-06-17 DIAGNOSIS — R9439 Abnormal result of other cardiovascular function study: Secondary | ICD-10-CM | POA: Diagnosis not present

## 2015-06-17 DIAGNOSIS — Z95810 Presence of automatic (implantable) cardiac defibrillator: Secondary | ICD-10-CM | POA: Diagnosis not present

## 2015-06-17 DIAGNOSIS — I420 Dilated cardiomyopathy: Secondary | ICD-10-CM | POA: Diagnosis not present

## 2015-06-17 DIAGNOSIS — I480 Paroxysmal atrial fibrillation: Secondary | ICD-10-CM | POA: Diagnosis not present

## 2015-06-17 DIAGNOSIS — I351 Nonrheumatic aortic (valve) insufficiency: Secondary | ICD-10-CM | POA: Diagnosis not present

## 2015-06-19 ENCOUNTER — Encounter: Payer: Self-pay | Admitting: Family Medicine

## 2015-06-19 ENCOUNTER — Ambulatory Visit (INDEPENDENT_AMBULATORY_CARE_PROVIDER_SITE_OTHER): Payer: Medicare Other | Admitting: Family Medicine

## 2015-06-19 ENCOUNTER — Other Ambulatory Visit: Payer: Self-pay | Admitting: Family Medicine

## 2015-06-19 VITALS — BP 155/88 | HR 62 | Wt 175.0 lb

## 2015-06-19 DIAGNOSIS — I10 Essential (primary) hypertension: Secondary | ICD-10-CM | POA: Diagnosis not present

## 2015-06-19 DIAGNOSIS — I482 Chronic atrial fibrillation, unspecified: Secondary | ICD-10-CM

## 2015-06-19 DIAGNOSIS — I5022 Chronic systolic (congestive) heart failure: Secondary | ICD-10-CM | POA: Diagnosis not present

## 2015-06-19 MED ORDER — CARVEDILOL 25 MG PO TABS: 25.0000 mg | ORAL_TABLET | Freq: Two times a day (BID) | ORAL | Status: DC

## 2015-06-19 NOTE — Progress Notes (Signed)
CC: Lindsey Reynolds is a 82 y.o. female is here for Hypertension   Subjective: HPI: follow-up essential hypertension: She is taking 12.5 mg of Coreg twice a day. Blood pressures at home  Are consistently in the stage I or stage II hypertensive range. She denies chest pain shortness of breath orthopnea or peripheral edema at the present time. She had some peripheral edema earlier this week but it resolved with taking Lasix.   Follow-up atrial fibrillation: She states that her rhythm has been regular and there's been no  Irregularity to this ever since she was cardioverted in February. She's advised to get a CT scan  Of her chest to evaluate for coronary artery calcifications however she tells me that she does not want to ever get catheterization  Unlessn eeded emergently.   Review Of Systems Outlined In HPI  Past Medical History  Diagnosis Date  . Atrial fibrillation (HCC) 06/05/2014  . Congestive heart failure (HCC) 06/05/2014  . LBBB (left bundle branch block) 06/05/2014  . Aortic valve regurgitation 06/05/2014    Past Surgical History  Procedure Laterality Date  . Abdominal hysterectomy    . Gallbladder surgery      20 years ago  . Cardoversion      x 2 2015  . Placement of defibrillator      2012  . Biv icd genertaor change out     No family history on file.  Social History   Social History  . Marital Status: Married    Spouse Name: N/A  . Number of Children: N/A  . Years of Education: N/A   Occupational History  . Not on file.   Social History Main Topics  . Smoking status: Never Smoker   . Smokeless tobacco: Not on file  . Alcohol Use: 0.0 oz/week    0 Standard drinks or equivalent per week  . Drug Use: No  . Sexual Activity:    Partners: Male   Other Topics Concern  . Not on file   Social History Narrative     Objective: BP 155/88 mmHg  Pulse 62  Wt 175 lb (79.379 kg)  General: Alert and Oriented, No Acute Distress HEENT: Pupils equal, round, reactive  to light. Conjunctivae clear.   Lungs: Clear to auscultation bilaterally, no wheezing/ronchi/rales.  Comfortable work of breathing. Good air movement. Cardiac: Regular rate and rhythm. Normal S1/S2.  No murmurs, rubs, nor gallops.   Extremities: No peripheral edema.  Strong peripheral pulses.  Mental Status: No depression, anxiety, nor agitation. Skin: Warm and dry.  Assessment & Plan: Lindsey Reynolds was seen today for hypertension.  Diagnoses and all orders for this visit:  Chronic atrial fibrillation (HCC)  Essential hypertension -     carvedilol (COREG) 25 MG tablet; Take 1 tablet (25 mg total) by mouth 2 (two) times daily with a meal.  Chronic systolic heart failure (HCC) -     B Nat Peptide    atrial fibrillation: Currently rate controlled and in sinus rhythm. Properly anticoagulated.  Essential hypertension: Uncontrolled chronic condition increasing carvedilol  chronic systolic heart failure: Currently euvolemic will continue to take furosemide only on an as-needed basis. BNP was slightly elevated earlier this week, rechecking today.  Return in about 3 months (around 09/18/2015).

## 2015-06-20 LAB — BRAIN NATRIURETIC PEPTIDE: BRAIN NATRIURETIC PEPTIDE: 254.8 pg/mL — AB (ref <100)

## 2015-06-30 DIAGNOSIS — Z95818 Presence of other cardiac implants and grafts: Secondary | ICD-10-CM | POA: Diagnosis not present

## 2015-06-30 DIAGNOSIS — Z95810 Presence of automatic (implantable) cardiac defibrillator: Secondary | ICD-10-CM | POA: Diagnosis not present

## 2015-06-30 DIAGNOSIS — Z4502 Encounter for adjustment and management of automatic implantable cardiac defibrillator: Secondary | ICD-10-CM | POA: Diagnosis not present

## 2015-06-30 DIAGNOSIS — I255 Ischemic cardiomyopathy: Secondary | ICD-10-CM | POA: Diagnosis not present

## 2015-07-15 DIAGNOSIS — I255 Ischemic cardiomyopathy: Secondary | ICD-10-CM | POA: Diagnosis not present

## 2015-07-15 DIAGNOSIS — I4892 Unspecified atrial flutter: Secondary | ICD-10-CM | POA: Insufficient documentation

## 2015-07-15 DIAGNOSIS — Z7901 Long term (current) use of anticoagulants: Secondary | ICD-10-CM | POA: Diagnosis not present

## 2015-07-15 DIAGNOSIS — I481 Persistent atrial fibrillation: Secondary | ICD-10-CM | POA: Diagnosis not present

## 2015-07-15 DIAGNOSIS — Z95818 Presence of other cardiac implants and grafts: Secondary | ICD-10-CM | POA: Diagnosis not present

## 2015-07-15 DIAGNOSIS — Z95810 Presence of automatic (implantable) cardiac defibrillator: Secondary | ICD-10-CM | POA: Diagnosis not present

## 2015-07-31 DIAGNOSIS — I471 Supraventricular tachycardia: Secondary | ICD-10-CM | POA: Insufficient documentation

## 2015-08-05 DIAGNOSIS — Z95818 Presence of other cardiac implants and grafts: Secondary | ICD-10-CM | POA: Diagnosis not present

## 2015-08-05 DIAGNOSIS — I255 Ischemic cardiomyopathy: Secondary | ICD-10-CM | POA: Diagnosis not present

## 2015-08-05 DIAGNOSIS — Z95810 Presence of automatic (implantable) cardiac defibrillator: Secondary | ICD-10-CM | POA: Diagnosis not present

## 2015-08-05 DIAGNOSIS — I4892 Unspecified atrial flutter: Secondary | ICD-10-CM | POA: Diagnosis not present

## 2015-08-14 DIAGNOSIS — Z95810 Presence of automatic (implantable) cardiac defibrillator: Secondary | ICD-10-CM | POA: Diagnosis not present

## 2015-08-14 DIAGNOSIS — I480 Paroxysmal atrial fibrillation: Secondary | ICD-10-CM | POA: Diagnosis not present

## 2015-08-14 DIAGNOSIS — I4892 Unspecified atrial flutter: Secondary | ICD-10-CM | POA: Diagnosis not present

## 2015-08-14 DIAGNOSIS — I420 Dilated cardiomyopathy: Secondary | ICD-10-CM | POA: Diagnosis not present

## 2015-08-15 DIAGNOSIS — I480 Paroxysmal atrial fibrillation: Secondary | ICD-10-CM | POA: Diagnosis not present

## 2015-08-15 DIAGNOSIS — I4892 Unspecified atrial flutter: Secondary | ICD-10-CM | POA: Diagnosis not present

## 2015-08-18 DIAGNOSIS — Z7982 Long term (current) use of aspirin: Secondary | ICD-10-CM | POA: Diagnosis not present

## 2015-08-18 DIAGNOSIS — I420 Dilated cardiomyopathy: Secondary | ICD-10-CM | POA: Diagnosis not present

## 2015-08-18 DIAGNOSIS — I484 Atypical atrial flutter: Secondary | ICD-10-CM | POA: Diagnosis not present

## 2015-08-18 DIAGNOSIS — Z7901 Long term (current) use of anticoagulants: Secondary | ICD-10-CM | POA: Diagnosis not present

## 2015-08-18 DIAGNOSIS — I480 Paroxysmal atrial fibrillation: Secondary | ICD-10-CM | POA: Diagnosis not present

## 2015-08-19 DIAGNOSIS — Z9889 Other specified postprocedural states: Secondary | ICD-10-CM | POA: Diagnosis not present

## 2015-08-19 DIAGNOSIS — I4891 Unspecified atrial fibrillation: Secondary | ICD-10-CM | POA: Diagnosis not present

## 2015-08-19 DIAGNOSIS — Z8679 Personal history of other diseases of the circulatory system: Secondary | ICD-10-CM | POA: Insufficient documentation

## 2015-08-29 ENCOUNTER — Other Ambulatory Visit: Payer: Self-pay | Admitting: Family Medicine

## 2015-09-15 DIAGNOSIS — R531 Weakness: Secondary | ICD-10-CM | POA: Diagnosis not present

## 2015-09-15 DIAGNOSIS — I480 Paroxysmal atrial fibrillation: Secondary | ICD-10-CM | POA: Diagnosis not present

## 2015-09-15 DIAGNOSIS — I482 Chronic atrial fibrillation: Secondary | ICD-10-CM | POA: Diagnosis not present

## 2015-09-15 DIAGNOSIS — I509 Heart failure, unspecified: Secondary | ICD-10-CM | POA: Diagnosis not present

## 2015-09-15 DIAGNOSIS — I4891 Unspecified atrial fibrillation: Secondary | ICD-10-CM | POA: Diagnosis not present

## 2015-09-15 DIAGNOSIS — I110 Hypertensive heart disease with heart failure: Secondary | ICD-10-CM | POA: Diagnosis not present

## 2015-09-15 DIAGNOSIS — I447 Left bundle-branch block, unspecified: Secondary | ICD-10-CM | POA: Diagnosis not present

## 2015-09-15 DIAGNOSIS — Z7901 Long term (current) use of anticoagulants: Secondary | ICD-10-CM | POA: Diagnosis not present

## 2015-09-15 DIAGNOSIS — I272 Other secondary pulmonary hypertension: Secondary | ICD-10-CM | POA: Diagnosis not present

## 2015-09-15 DIAGNOSIS — Z95810 Presence of automatic (implantable) cardiac defibrillator: Secondary | ICD-10-CM | POA: Diagnosis not present

## 2015-09-15 DIAGNOSIS — I4892 Unspecified atrial flutter: Secondary | ICD-10-CM | POA: Diagnosis not present

## 2015-09-15 DIAGNOSIS — Z9889 Other specified postprocedural states: Secondary | ICD-10-CM | POA: Diagnosis not present

## 2015-09-15 DIAGNOSIS — R002 Palpitations: Secondary | ICD-10-CM | POA: Diagnosis not present

## 2015-09-16 ENCOUNTER — Ambulatory Visit (INDEPENDENT_AMBULATORY_CARE_PROVIDER_SITE_OTHER): Payer: Medicare Other | Admitting: Family Medicine

## 2015-09-16 ENCOUNTER — Encounter: Payer: Self-pay | Admitting: Family Medicine

## 2015-09-16 VITALS — BP 114/77 | HR 72

## 2015-09-16 DIAGNOSIS — I482 Chronic atrial fibrillation, unspecified: Secondary | ICD-10-CM

## 2015-09-16 DIAGNOSIS — R5383 Other fatigue: Secondary | ICD-10-CM

## 2015-09-16 NOTE — Progress Notes (Signed)
CC: Lindsey Reynolds is a 82 y.o. female is here for No chief complaint on file.   Subjective: HPI:  Ever since a cardiac ablation last month patient reports excessive fatigue and reduced strength and reduced endurance. The only thing that's changed since this time was starting on a new antiarrhythmic called propafenone. She gives me example of not being able to stand for more than 10 minutes without having to go sit down and rest for a few minutes. Additionally she feels that she can't lift much more than a gallon of milk. She denies any shortness of breath. She had some lightheadedness however this improved after she got a cardioversion yesterday. She denies any new motor or sensory sources other than that described above. She wants know if she should do with all the options that her cardiologist is layering out her.    Review Of Systems Outlined In HPI  Past Medical History  Diagnosis Date  . Atrial fibrillation (HCC) 06/05/2014  . Congestive heart failure (HCC) 06/05/2014  . LBBB (left bundle branch block) 06/05/2014  . Aortic valve regurgitation 06/05/2014    Past Surgical History  Procedure Laterality Date  . Abdominal hysterectomy    . Gallbladder surgery      20 years ago  . Cardoversion      x 2 2015  . Placement of defibrillator      2012  . Biv icd genertaor change out     No family history on file.  Social History   Social History  . Marital Status: Married    Spouse Name: N/A  . Number of Children: N/A  . Years of Education: N/A   Occupational History  . Not on file.   Social History Main Topics  . Smoking status: Never Smoker   . Smokeless tobacco: Not on file  . Alcohol Use: 0.0 oz/week    0 Standard drinks or equivalent per week  . Drug Use: No  . Sexual Activity:    Partners: Male   Other Topics Concern  . Not on file   Social History Narrative     Objective: BP 114/77 mmHg  Pulse 72  General: Alert and Oriented, No Acute Distress HEENT:  Pupils equal, round, reactive to light. Conjunctivae clear.  Moist mucous membranes pharynx unremarkable Lungs: Clear to auscultation bilaterally, no wheezing/ronchi/rales.  Comfortable work of breathing. Good air movement. Cardiac: Regular rate and rhythm. Normal S1/S2.  No murmurs, rubs, nor gallops.   Extremities: No peripheral edema.  Strong peripheral pulses.  Mental Status: No depression, anxiety, nor agitation. Skin: Warm and dry.  Assessment & Plan: Diagnoses and all orders for this visit:  Chronic atrial fibrillation (HCC) -     B12 -     Comprehensive Metabolic Panel (CMET) -     VITAMIN D 25 Hydroxy (Vit-D Deficiency, Fractures) -     CBC  Other fatigue   Atrial fibrillation with fatigue: Rule out B12 and vitamin D deficiency. Rule out anemia and metabolic abnormality. I discussed my suspicion that some of her symptoms could be caused by the propafenone. I've asked her to discuss possibility of changing this or if this is the only antiarrhythmic that she is a candidate for. I also let them know that he wants to take their electrophysiologic questions to their cardiology team if all the above labs are normal.  25 minutes spent face-to-face during visit today of which at least 50% was counseling or coordinating care regarding: 1. Chronic atrial fibrillation (HCC)  2. Other fatigue     Discussed with this patient that I will be resigning from my position here with [facility] in September in order to stay with my family who will be moving to [facility]. I let him know about the providers that are still accepting patients and I feel that this individual will be under great care if he/she stays here with [facility].   No Follow-up on file.

## 2015-09-17 LAB — VITAMIN D 25 HYDROXY (VIT D DEFICIENCY, FRACTURES): Vit D, 25-Hydroxy: 43 ng/mL (ref 30–100)

## 2015-09-17 LAB — COMPREHENSIVE METABOLIC PANEL
ALK PHOS: 172 U/L — AB (ref 33–130)
ALT: 41 U/L — AB (ref 6–29)
AST: 41 U/L — AB (ref 10–35)
Albumin: 3.5 g/dL — ABNORMAL LOW (ref 3.6–5.1)
BILIRUBIN TOTAL: 0.4 mg/dL (ref 0.2–1.2)
BUN: 15 mg/dL (ref 7–25)
CALCIUM: 8.6 mg/dL (ref 8.6–10.4)
CO2: 19 mmol/L — AB (ref 20–31)
CREATININE: 1.23 mg/dL — AB (ref 0.60–0.88)
Chloride: 105 mmol/L (ref 98–110)
GLUCOSE: 83 mg/dL (ref 65–99)
Potassium: 3.4 mmol/L — ABNORMAL LOW (ref 3.5–5.3)
SODIUM: 138 mmol/L (ref 135–146)
Total Protein: 6.2 g/dL (ref 6.1–8.1)

## 2015-09-17 LAB — CBC
HEMATOCRIT: 33.7 % — AB (ref 35.0–45.0)
Hemoglobin: 11.2 g/dL — ABNORMAL LOW (ref 11.7–15.5)
MCH: 29.9 pg (ref 27.0–33.0)
MCHC: 33.2 g/dL (ref 32.0–36.0)
MCV: 89.9 fL (ref 80.0–100.0)
MPV: 8.6 fL (ref 7.5–12.5)
Platelets: 228 10*3/uL (ref 140–400)
RBC: 3.75 MIL/uL — ABNORMAL LOW (ref 3.80–5.10)
RDW: 14 % (ref 11.0–15.0)
WBC: 5.8 10*3/uL (ref 3.8–10.8)

## 2015-09-17 LAB — VITAMIN B12: VITAMIN B 12: 341 pg/mL (ref 200–1100)

## 2015-10-29 ENCOUNTER — Other Ambulatory Visit: Payer: Self-pay

## 2015-11-06 DIAGNOSIS — Z8679 Personal history of other diseases of the circulatory system: Secondary | ICD-10-CM | POA: Diagnosis not present

## 2015-11-06 DIAGNOSIS — I447 Left bundle-branch block, unspecified: Secondary | ICD-10-CM | POA: Diagnosis not present

## 2015-11-06 DIAGNOSIS — I480 Paroxysmal atrial fibrillation: Secondary | ICD-10-CM | POA: Diagnosis not present

## 2015-11-06 DIAGNOSIS — Z9889 Other specified postprocedural states: Secondary | ICD-10-CM | POA: Diagnosis not present

## 2015-11-06 DIAGNOSIS — I420 Dilated cardiomyopathy: Secondary | ICD-10-CM | POA: Diagnosis not present

## 2015-11-06 DIAGNOSIS — I483 Typical atrial flutter: Secondary | ICD-10-CM | POA: Diagnosis not present

## 2015-11-06 DIAGNOSIS — Z7901 Long term (current) use of anticoagulants: Secondary | ICD-10-CM | POA: Diagnosis not present

## 2015-11-06 DIAGNOSIS — Z95810 Presence of automatic (implantable) cardiac defibrillator: Secondary | ICD-10-CM | POA: Diagnosis not present

## 2015-11-06 DIAGNOSIS — Z7982 Long term (current) use of aspirin: Secondary | ICD-10-CM | POA: Diagnosis not present

## 2015-11-07 DIAGNOSIS — I480 Paroxysmal atrial fibrillation: Secondary | ICD-10-CM | POA: Diagnosis not present

## 2015-11-07 DIAGNOSIS — I5022 Chronic systolic (congestive) heart failure: Secondary | ICD-10-CM | POA: Diagnosis not present

## 2015-11-07 DIAGNOSIS — I420 Dilated cardiomyopathy: Secondary | ICD-10-CM | POA: Diagnosis not present

## 2015-11-07 DIAGNOSIS — I471 Supraventricular tachycardia: Secondary | ICD-10-CM | POA: Diagnosis not present

## 2015-11-07 DIAGNOSIS — Z950 Presence of cardiac pacemaker: Secondary | ICD-10-CM | POA: Diagnosis not present

## 2015-11-07 DIAGNOSIS — I483 Typical atrial flutter: Secondary | ICD-10-CM | POA: Diagnosis not present

## 2015-11-11 DIAGNOSIS — Z8679 Personal history of other diseases of the circulatory system: Secondary | ICD-10-CM | POA: Diagnosis not present

## 2015-11-11 DIAGNOSIS — I480 Paroxysmal atrial fibrillation: Secondary | ICD-10-CM | POA: Diagnosis not present

## 2015-11-11 DIAGNOSIS — I420 Dilated cardiomyopathy: Secondary | ICD-10-CM | POA: Diagnosis not present

## 2015-11-11 DIAGNOSIS — Z9889 Other specified postprocedural states: Secondary | ICD-10-CM | POA: Diagnosis not present

## 2015-11-11 DIAGNOSIS — Z7982 Long term (current) use of aspirin: Secondary | ICD-10-CM | POA: Diagnosis not present

## 2015-11-11 DIAGNOSIS — Z7901 Long term (current) use of anticoagulants: Secondary | ICD-10-CM | POA: Diagnosis not present

## 2015-11-11 DIAGNOSIS — Z95810 Presence of automatic (implantable) cardiac defibrillator: Secondary | ICD-10-CM | POA: Diagnosis not present

## 2015-11-11 DIAGNOSIS — I340 Nonrheumatic mitral (valve) insufficiency: Secondary | ICD-10-CM | POA: Diagnosis not present

## 2015-11-11 DIAGNOSIS — I10 Essential (primary) hypertension: Secondary | ICD-10-CM | POA: Diagnosis not present

## 2015-11-11 DIAGNOSIS — I351 Nonrheumatic aortic (valve) insufficiency: Secondary | ICD-10-CM | POA: Diagnosis not present

## 2015-11-13 DIAGNOSIS — Z23 Encounter for immunization: Secondary | ICD-10-CM | POA: Diagnosis not present

## 2015-12-31 ENCOUNTER — Other Ambulatory Visit: Payer: Self-pay | Admitting: Family Medicine

## 2015-12-31 ENCOUNTER — Ambulatory Visit (INDEPENDENT_AMBULATORY_CARE_PROVIDER_SITE_OTHER): Payer: Medicare Other | Admitting: Family Medicine

## 2015-12-31 VITALS — BP 146/90 | HR 81 | Wt 174.0 lb

## 2015-12-31 DIAGNOSIS — Z8679 Personal history of other diseases of the circulatory system: Secondary | ICD-10-CM

## 2015-12-31 DIAGNOSIS — I340 Nonrheumatic mitral (valve) insufficiency: Secondary | ICD-10-CM

## 2015-12-31 DIAGNOSIS — I509 Heart failure, unspecified: Secondary | ICD-10-CM

## 2015-12-31 DIAGNOSIS — I482 Chronic atrial fibrillation, unspecified: Secondary | ICD-10-CM

## 2015-12-31 DIAGNOSIS — I10 Essential (primary) hypertension: Secondary | ICD-10-CM

## 2015-12-31 DIAGNOSIS — E559 Vitamin D deficiency, unspecified: Secondary | ICD-10-CM | POA: Insufficient documentation

## 2015-12-31 DIAGNOSIS — I420 Dilated cardiomyopathy: Secondary | ICD-10-CM

## 2015-12-31 DIAGNOSIS — I4892 Unspecified atrial flutter: Secondary | ICD-10-CM

## 2015-12-31 DIAGNOSIS — Z95810 Presence of automatic (implantable) cardiac defibrillator: Secondary | ICD-10-CM

## 2015-12-31 DIAGNOSIS — I351 Nonrheumatic aortic (valve) insufficiency: Secondary | ICD-10-CM

## 2015-12-31 DIAGNOSIS — Z9889 Other specified postprocedural states: Secondary | ICD-10-CM

## 2015-12-31 DIAGNOSIS — I471 Supraventricular tachycardia: Secondary | ICD-10-CM

## 2015-12-31 MED ORDER — CARVEDILOL 12.5 MG PO TABS: 12.5000 mg | ORAL_TABLET | Freq: Two times a day (BID) | ORAL | 1 refills | Status: DC

## 2015-12-31 NOTE — Progress Notes (Signed)
Lindsey Reynolds is a 82 y.o. female who presents to [facility]: Primary Care Sports Medicine today for establish care and follow-up cardiac disease, vitamin D deficiency, and anticoagulation.  Patient was previously seen by Dr. Ivan Reynolds who has since left the practice. This is her first visit with me.  Cardiac disease: Patient's major medical problems center around her heart. Fundamentally she has a true fibrillation and heart failure and valvular heart disease. Her arrhythmia is managed with Coreg and Propafenone as well as a Production assistant, radio. She sees cardiology at [facility] for this. She is feeling reasonably well but does note activity limiting shortness of breath and fatigue.    Vitamin D deficiency: Currently taking vitamin D daily. She notes several years ago she had a bone density test that was excellent.  Hormone replacement therapy: Patient is menopausal and uses estrogen for hormone replacement. She's been on this medication for quite a while now. She is very resistant to stopping. She is status post hysterectomy. She understands her risks for breast cancer and DVT.  Anticoagulation: As part of her heart disease she takes Xarelto daily for prophylaxis. She denies any bleeding.   Past Medical History:  Diagnosis Date  . Aortic valve regurgitation 06/05/2014  . Atrial fibrillation (HCC) 06/05/2014  . Congestive heart failure (HCC) 06/05/2014  . LBBB (left bundle branch block) 06/05/2014   Past Surgical History:  Procedure Laterality Date  . ABDOMINAL HYSTERECTOMY    . BIV ICD GENERTAOR CHANGE OUT    . cardoversion     x 2 2015  . GALLBLADDER SURGERY     20 years ago  . placement of defibrillator     2012   Social History  Substance Use Topics  . Smoking status: Never Smoker  . Smokeless tobacco: Not on file  . Alcohol use 0.0 oz/week   family history is not on file.  ROS as  above:  Medications: Current Outpatient Prescriptions  Medication Sig Dispense Refill  . aspirin EC 81 MG tablet Take 81 mg by mouth.    . cholecalciferol (VITAMIN D) 1000 UNITS tablet One by mouth daily    . estrogens, conjugated, (PREMARIN) 0.625 MG tablet One by mouth daily 90 tablet 2  . FLUZONE HIGH-DOSE 0.5 ML SUSY ADM 0.5ML IM UTD  0  . furosemide (LASIX) 20 MG tablet Take 20 mg by mouth.    . propafenone (RYTHMOL SR) 225 MG 12 hr capsule Take 225 mg by mouth.    . Rivaroxaban (XARELTO) 15 MG TABS tablet Take 1 tablet (15 mg total) by mouth daily with supper. 30 tablet   . carvedilol (COREG) 12.5 MG tablet Take 1 tablet (12.5 mg total) by mouth 2 (two) times daily with a meal. 180 tablet 1   No current facility-administered medications for this visit.    Allergies  Allergen Reactions  . Amiodarone Other (See Comments)    Blurred Vision  . Atorvastatin Other (See Comments)    cardiovascular arrest  . Lisinopril     hypotension  . Rosuvastatin Other (See Comments)    cardiovascular arrest  . Statins   . Penicillins Rash and Swelling    Health Maintenance Health Maintenance  Topic Date Due  . DEXA SCAN  04/01/2016 (Originally 01/10/1999)  . TETANUS/TDAP  10/30/2016 (Originally 01/09/1951)  . ZOSTAVAX  10/30/2028 (Originally 01/09/1994)  . PNA vac Low Risk Adult (2 of 2 - PPSV23) 03/18/2016  . INFLUENZA VACCINE  Completed  Exam:  BP (!) 146/90   Pulse 81   Wt 174 lb (78.9 kg)   BMI 28.08 kg/m  Gen: Well NAD HEENT: EOMI,  MMM Lungs: Normal work of breathing. CTABL Heart: RRR no MRG Abd: NABS, Soft. Nondistended, Nontender Exts: Brisk capillary refill, warm and well perfused. No edema  Notes and imaging and laboratory studies reviewed through care everywhere   No results found for this or any previous visit (from the past 72 hour(s)). No results found.     Assessment and Plan: 82 y.o. female with  Heart disease: Currently pretty well managed. Since  that she is doing better than she has been in many years. I don't plan on changing anything however would like to obtain fasting labs in the near future. Patient will return in a few months for wellness visit. Otherwise continue current medications.  Vitamin D deficiency: Doing well. Check vitamin D level.  Hormone replacement therapy: Discussed pros and cons. Continue estrogen.  Anticoagulation: Continue Xarelto.   Orders Placed This Encounter  Procedures  . CBC  . COMPLETE METABOLIC PANEL WITH GFR  . VITAMIN D 25 Hydroxy (Vit-D Deficiency, Fractures)  . Lipid panel    Discussed warning signs or symptoms. Please see discharge instructions. Patient expresses understanding.  I spent 40 minutes with this patient, greater than 50% was face-to-face time counseling regarding the above diagnosis.

## 2015-12-31 NOTE — Patient Instructions (Signed)
Thank you for coming in today. Continue current medicines.  Return in January.  Return sooner if needed.   Get fasting labs before the next visit.

## 2016-02-10 DIAGNOSIS — Z95810 Presence of automatic (implantable) cardiac defibrillator: Secondary | ICD-10-CM | POA: Diagnosis not present

## 2016-02-10 DIAGNOSIS — I420 Dilated cardiomyopathy: Secondary | ICD-10-CM | POA: Diagnosis not present

## 2016-02-10 DIAGNOSIS — I5022 Chronic systolic (congestive) heart failure: Secondary | ICD-10-CM | POA: Diagnosis not present

## 2016-02-10 DIAGNOSIS — Z95818 Presence of other cardiac implants and grafts: Secondary | ICD-10-CM | POA: Diagnosis not present

## 2016-02-10 DIAGNOSIS — I255 Ischemic cardiomyopathy: Secondary | ICD-10-CM | POA: Diagnosis not present

## 2016-03-04 DIAGNOSIS — I509 Heart failure, unspecified: Secondary | ICD-10-CM | POA: Diagnosis not present

## 2016-03-04 DIAGNOSIS — I482 Chronic atrial fibrillation: Secondary | ICD-10-CM | POA: Diagnosis not present

## 2016-03-04 DIAGNOSIS — I10 Essential (primary) hypertension: Secondary | ICD-10-CM | POA: Diagnosis not present

## 2016-03-04 DIAGNOSIS — E559 Vitamin D deficiency, unspecified: Secondary | ICD-10-CM | POA: Diagnosis not present

## 2016-03-04 LAB — COMPLETE METABOLIC PANEL WITH GFR
ALBUMIN: 3.6 g/dL (ref 3.6–5.1)
ALK PHOS: 49 U/L (ref 33–130)
ALT: 6 U/L (ref 6–29)
AST: 12 U/L (ref 10–35)
BUN: 16 mg/dL (ref 7–25)
CALCIUM: 8.7 mg/dL (ref 8.6–10.4)
CO2: 23 mmol/L (ref 20–31)
CREATININE: 1.2 mg/dL — AB (ref 0.60–0.88)
Chloride: 103 mmol/L (ref 98–110)
GFR, EST AFRICAN AMERICAN: 49 mL/min — AB (ref >=60)
GFR, Est Non African American: 42 mL/min — ABNORMAL LOW (ref >=60)
Glucose, Bld: 90 mg/dL (ref 65–99)
POTASSIUM: 4.1 mmol/L (ref 3.5–5.3)
Sodium: 138 mmol/L (ref 135–146)
Total Bilirubin: 0.5 mg/dL (ref 0.2–1.2)
Total Protein: 6.1 g/dL (ref 6.1–8.1)

## 2016-03-04 LAB — CBC
HEMATOCRIT: 36.7 % (ref 35.0–45.0)
HEMOGLOBIN: 12.2 g/dL (ref 11.7–15.5)
MCH: 30 pg (ref 27.0–33.0)
MCHC: 33.2 g/dL (ref 32.0–36.0)
MCV: 90.4 fL (ref 80.0–100.0)
MPV: 8.6 fL (ref 7.5–12.5)
Platelets: 215 10*3/uL (ref 140–400)
RBC: 4.06 MIL/uL (ref 3.80–5.10)
RDW: 13.8 % (ref 11.0–15.0)
WBC: 6.1 10*3/uL (ref 3.8–10.8)

## 2016-03-04 LAB — LIPID PANEL
Cholesterol: 231 mg/dL — ABNORMAL HIGH (ref <200)
HDL: 77 mg/dL (ref >50)
LDL Cholesterol: 122 mg/dL — ABNORMAL HIGH (ref <100)
Total CHOL/HDL Ratio: 3 Ratio (ref <5.0)
Triglycerides: 161 mg/dL — ABNORMAL HIGH (ref <150)
VLDL: 32 mg/dL — ABNORMAL HIGH (ref <30)

## 2016-03-05 LAB — VITAMIN D 25 HYDROXY (VIT D DEFICIENCY, FRACTURES): VIT D 25 HYDROXY: 45 ng/mL (ref 30–100)

## 2016-03-10 ENCOUNTER — Encounter: Payer: Self-pay | Admitting: Family Medicine

## 2016-03-10 ENCOUNTER — Ambulatory Visit (INDEPENDENT_AMBULATORY_CARE_PROVIDER_SITE_OTHER): Payer: Medicare Other | Admitting: Family Medicine

## 2016-03-10 VITALS — BP 101/70 | HR 89 | Wt 171.0 lb

## 2016-03-10 DIAGNOSIS — I482 Chronic atrial fibrillation, unspecified: Secondary | ICD-10-CM

## 2016-03-10 DIAGNOSIS — I4892 Unspecified atrial flutter: Secondary | ICD-10-CM

## 2016-03-10 DIAGNOSIS — I471 Supraventricular tachycardia: Secondary | ICD-10-CM

## 2016-03-10 DIAGNOSIS — Z780 Asymptomatic menopausal state: Secondary | ICD-10-CM | POA: Diagnosis not present

## 2016-03-10 DIAGNOSIS — Z95810 Presence of automatic (implantable) cardiac defibrillator: Secondary | ICD-10-CM

## 2016-03-10 DIAGNOSIS — I420 Dilated cardiomyopathy: Secondary | ICD-10-CM

## 2016-03-10 DIAGNOSIS — I10 Essential (primary) hypertension: Secondary | ICD-10-CM

## 2016-03-10 DIAGNOSIS — I509 Heart failure, unspecified: Secondary | ICD-10-CM

## 2016-03-10 MED ORDER — ESTROGENS CONJUGATED 0.625 MG PO TABS: ORAL_TABLET | ORAL | 2 refills | Status: DC

## 2016-03-10 NOTE — Patient Instructions (Signed)
Thank you for coming in today. Continue current medicines.  Recheck in 6 months.  Return sooner if needed.

## 2016-03-10 NOTE — Progress Notes (Signed)
Lindsey Reynolds is a 83 y.o. female who presents to [name] today for follow-up heart failure. Patient has significant heart failure complicated by atrial fibrillation. This is well managed by cardiology. In the interim patient has decreased her carvedilol to 6.25 twice daily. She noted and she has over the last few months than she hasn't last year. She notes she continues to have significant limitation in her activity. She has trouble with shortness of breath with even moderate levels of activity. This is stable however.   Past Medical History:  Diagnosis Date  . Aortic valve regurgitation 06/05/2014  . Atrial fibrillation (HCC) 06/05/2014  . Congestive heart failure (HCC) 06/05/2014  . LBBB (left bundle branch block) 06/05/2014   Past Surgical History:  Procedure Laterality Date  . ABDOMINAL HYSTERECTOMY    . BIV ICD GENERTAOR CHANGE OUT    . cardoversion     x 2 2015  . GALLBLADDER SURGERY     20 years ago  . placement of defibrillator     2012   Social History  Substance Use Topics  . Smoking status: Never Smoker  . Smokeless tobacco: Not on file  . Alcohol use 0.0 oz/week   family history is not on file.  ROS as above:  Medications: Current Outpatient Prescriptions  Medication Sig Dispense Refill  . aspirin EC 81 MG tablet Take 81 mg by mouth.    . carvedilol (COREG) 12.5 MG tablet Take 1 tablet (12.5 mg total) by mouth 2 (two) times daily with a meal. (Patient taking differently: Take 6.25 mg by mouth 2 (two) times daily with a meal. ) 180 tablet 1  . cholecalciferol (VITAMIN D) 1000 UNITS tablet One by mouth daily    . estrogens, conjugated, (PREMARIN) 0.625 MG tablet One by mouth daily 90 tablet 2  . furosemide (LASIX) 20 MG tablet Take 20 mg by mouth.    . propafenone (RYTHMOL SR) 225 MG 12 hr capsule Take 225 mg by mouth.    . Rivaroxaban  (XARELTO) 15 MG TABS tablet Take 1 tablet (15 mg total) by mouth daily with supper. 30 tablet    No current facility-administered medications for this visit.    Allergies  Allergen Reactions  . Amiodarone Other (See Comments)    Blurred Vision  . Atorvastatin Other (See Comments)    cardiovascular arrest  . Lisinopril     hypotension  . Rosuvastatin Other (See Comments)    cardiovascular arrest  . Statins   . Penicillins Rash and Swelling    Health Maintenance Health Maintenance  Topic Date Due  . DEXA SCAN  04/01/2016 (Originally 01/10/1999)  . TETANUS/TDAP  10/30/2016 (Originally 01/09/1951)  . ZOSTAVAX  10/30/2028 (Originally 01/09/1994)  . PNA vac Low Risk Adult (2 of 2 - PPSV23) 03/18/2016  . INFLUENZA VACCINE  Completed     Exam:  BP 101/70   Pulse 89   Wt 171 lb (77.6 kg)   SpO2 96%   BMI 27.60 kg/m  Gen: Well NAD HEENT: EOMI,  MMM Lungs: Normal work of breathing. CTABL Heart: Irregularly irregular with a heart rate around 100 bpm no MRG Abd: NABS, Soft. Nondistended, Nontender Exts: Brisk capillary refill, warm and well perfused.    No results found for this or any previous visit (from the past 72 hour(s)). No results found.    Assessment and Plan: 83 y.o. female with heart failure. Doing well. Managed with cardiology. Continue  current regimen.  Hormone Replacement therapy. We had a discussion about this twice now. Patient is very resistant to stopping Premarin. I think based on her risks versus benefits and I think is reasonable to continue Premarin. Patient understands she is at higher risk for heart attacks or strokes on this medicine than off of it.   Hyperlipidemia: Patient has elevated cholesterol and LDL of around 130. She had significant side effects with previous statins and is resistant to taking medications for right now. I think is reasonable to stay off of statins at this time. Recheck in about 6 months.  No orders of the defined types  were placed in this encounter.   Discussed warning signs or symptoms. Please see discharge instructions. Patient expresses understanding.

## 2016-03-23 DIAGNOSIS — I4892 Unspecified atrial flutter: Secondary | ICD-10-CM | POA: Diagnosis not present

## 2016-03-23 DIAGNOSIS — I484 Atypical atrial flutter: Secondary | ICD-10-CM | POA: Diagnosis not present

## 2016-03-23 DIAGNOSIS — Z0189 Encounter for other specified special examinations: Secondary | ICD-10-CM | POA: Diagnosis not present

## 2016-03-23 DIAGNOSIS — I480 Paroxysmal atrial fibrillation: Secondary | ICD-10-CM | POA: Diagnosis not present

## 2016-04-07 DIAGNOSIS — I484 Atypical atrial flutter: Secondary | ICD-10-CM | POA: Diagnosis not present

## 2016-04-07 DIAGNOSIS — I480 Paroxysmal atrial fibrillation: Secondary | ICD-10-CM | POA: Diagnosis not present

## 2016-04-12 DIAGNOSIS — I480 Paroxysmal atrial fibrillation: Secondary | ICD-10-CM | POA: Diagnosis not present

## 2016-04-12 DIAGNOSIS — I484 Atypical atrial flutter: Secondary | ICD-10-CM | POA: Diagnosis not present

## 2016-05-11 DIAGNOSIS — Z7901 Long term (current) use of anticoagulants: Secondary | ICD-10-CM | POA: Diagnosis not present

## 2016-05-11 DIAGNOSIS — I5020 Unspecified systolic (congestive) heart failure: Secondary | ICD-10-CM | POA: Diagnosis not present

## 2016-05-11 DIAGNOSIS — I484 Atypical atrial flutter: Secondary | ICD-10-CM | POA: Diagnosis not present

## 2016-05-11 DIAGNOSIS — I481 Persistent atrial fibrillation: Secondary | ICD-10-CM | POA: Diagnosis not present

## 2016-05-11 DIAGNOSIS — I4891 Unspecified atrial fibrillation: Secondary | ICD-10-CM | POA: Diagnosis not present

## 2016-05-11 DIAGNOSIS — Z95810 Presence of automatic (implantable) cardiac defibrillator: Secondary | ICD-10-CM | POA: Diagnosis not present

## 2016-05-11 DIAGNOSIS — Z95818 Presence of other cardiac implants and grafts: Secondary | ICD-10-CM | POA: Diagnosis not present

## 2016-05-11 DIAGNOSIS — Z9889 Other specified postprocedural states: Secondary | ICD-10-CM | POA: Diagnosis not present

## 2016-08-04 ENCOUNTER — Telehealth: Payer: Self-pay | Admitting: Family Medicine

## 2016-08-04 NOTE — Telephone Encounter (Signed)
Fine with me

## 2016-08-04 NOTE — Telephone Encounter (Signed)
Dr C: This patient wants to switch to Dr A for a female provider. Are you all okay with this? Thanks!

## 2016-08-04 NOTE — Telephone Encounter (Signed)
That sounds fine to me

## 2016-08-16 ENCOUNTER — Ambulatory Visit (INDEPENDENT_AMBULATORY_CARE_PROVIDER_SITE_OTHER): Payer: Medicare Other | Admitting: Osteopathic Medicine

## 2016-08-16 ENCOUNTER — Encounter: Payer: Self-pay | Admitting: Osteopathic Medicine

## 2016-08-16 VITALS — BP 147/95 | HR 79 | Wt 165.0 lb

## 2016-08-16 DIAGNOSIS — Z780 Asymptomatic menopausal state: Secondary | ICD-10-CM | POA: Diagnosis not present

## 2016-08-16 DIAGNOSIS — N3941 Urge incontinence: Secondary | ICD-10-CM | POA: Diagnosis not present

## 2016-08-16 DIAGNOSIS — I482 Chronic atrial fibrillation, unspecified: Secondary | ICD-10-CM

## 2016-08-16 NOTE — Progress Notes (Signed)
HPI: Lindsey Reynolds is a 83 y.o. female  who presents to [name] today, 08/16/16,  for chief complaint of:  Chief Complaint  Patient presents with  . Establish Care   Very pleasant patient here to establish care, prefers female provider. Nothing in particular bothering her today. Has been made the appointment for her, there is some confusion about "female issues"  Cardiac: Following with [facility] cardiology team for history of dilated cardiomyopathy, atrial fib/flutter, mitral valve regurgitation, bundle branch block, ICD. Patient denies chest pain, pressure, palpitations. Recent cardio notes reviewed.   Urge incontinence: Ongoing and getting a bit worse over the past several years. Doesn't bother her too much. She states that her overall priority is as long as her heart is doing okay she can live with everything else and would rather not add medications.    Past medical history, surgical history, social history and family history reviewed.  Patient Active Problem List   Diagnosis Date Noted  . Vitamin D deficiency 12/31/2015  . S/P ablation of atrial fibrillation 08/19/2015  . Atrial tachycardia (HCC) 07/31/2015  . Atrial flutter (HCC) 07/15/2015  . Biventricular ICD (implantable cardioverter-defibrillator) in place 12/03/2014  . Essential hypertension 09/11/2014  . Mitral valve regurgitation 06/13/2014  . Atrial fibrillation (HCC) 06/05/2014  . Aortic valve regurgitation 06/05/2014  . LBBB (left bundle branch block) 06/05/2014  . Congestive heart failure (HCC) 06/05/2014  . Menopause 06/05/2014  . Dilated cardiomyopathy (HCC) 10/18/2011    Current medication list and allergy/intolerance information reviewed.   Current Outpatient Prescriptions on File Prior to Visit  Medication Sig Dispense Refill  . carvedilol (COREG) 12.5 MG tablet Take 1 tablet (12.5 mg total) by mouth 2 (two) times daily with a meal. (Patient taking differently:  Take 6.25 mg by mouth 2 (two) times daily with a meal. ) 180 tablet 1  . cholecalciferol (VITAMIN D) 1000 UNITS tablet One by mouth daily    . estrogens, conjugated, (PREMARIN) 0.625 MG tablet One by mouth daily 90 tablet 2  . propafenone (RYTHMOL SR) 225 MG 12 hr capsule Take 225 mg by mouth.    . Rivaroxaban (XARELTO) 15 MG TABS tablet Take 1 tablet (15 mg total) by mouth daily with supper. 30 tablet   . furosemide (LASIX) 20 MG tablet Take 20 mg by mouth.     No current facility-administered medications on file prior to visit.    Allergies  Allergen Reactions  . Amiodarone Other (See Comments)    Blurred Vision  . Atorvastatin Other (See Comments)    cardiovascular arrest  . Lisinopril     hypotension  . Rosuvastatin Other (See Comments)    cardiovascular arrest  . Statins   . Penicillins Rash and Swelling      Review of Systems:  Constitutional: No recent illness  HEENT: No  headache, no vision change  Cardiac: No  chest pain, No  pressure, No palpitations  Respiratory:  No  shortness of breath. No  Cough  Gastrointestinal: No  abdominal pain  Musculoskeletal: No new myalgia/arthralgia  Skin: No  Rash  Hem/Onc: No  easy bruising/bleeding  Neurologic: No  weakness, No  Dizziness  Psychiatric: No  concerns with depression, No  concerns with anxiety  Exam:  BP (!) 147/95   Pulse 79   Wt 165 lb (74.8 kg)   BMI 26.63 kg/m   Constitutional: VS see above. General Appearance: alert, well-developed, well-nourished, NAD  Eyes: Normal lids and conjunctive, non-icteric sclera  Ears, Nose, Mouth, Throat: MMM, Normal external inspection ears/nares/mouth/lips/gums.  Neck: No masses, trachea midline.   Respiratory: Normal respiratory effort. no wheeze, no rhonchi, no rales  Cardiovascular: S1/S2 normal, no rub/gallop auscultated.    Musculoskeletal: Gait normal.   Neurological: Normal balance/coordination. No tremor.  Skin: warm, dry, intact.   Psychiatric:  Normal judgment/insight. Normal mood and affect. Oriented x3.     ASSESSMENT/PLAN:   Chronic atrial fibrillation (HCC) - Managed by cardiology, will defer to their expertise  Menopause - Patient would like to continue hormone replacement therapy, cardiology team is okay with this. Confirmed status post hysterectomy  Urgency incontinence - Advised timed voiding, avoidance of triggers such as caffeine, can consider medications if needed but if it doesn't bother her I wouldn't worry about it     Follow-up plan: Return in about 6 months (around 02/15/2017) for annual check-up, sooner if needed .  Visit summary with medication list and pertinent instructions was printed for patient to review, alert [place] if any changes needed. All questions at time of visit were answered - patient instructed to contact office with any additional concerns. ER/RTC precautions were reviewed with the patient and understanding verbalized.   Note: Total time spent 25 minutes, greater than 50% of the visit was spent face-to-face counseling and coordinating care for the following: The primary encounter diagnosis was Chronic atrial fibrillation (HCC). Diagnoses of Menopause and Urgency incontinence were also pertinent to this visit.Marland Kitchen

## 2016-08-16 NOTE — Patient Instructions (Signed)

## 2016-08-17 DIAGNOSIS — I509 Heart failure, unspecified: Secondary | ICD-10-CM | POA: Diagnosis not present

## 2016-09-08 ENCOUNTER — Ambulatory Visit: Payer: Medicare Other | Admitting: Family Medicine

## 2016-11-12 DIAGNOSIS — Z23 Encounter for immunization: Secondary | ICD-10-CM | POA: Diagnosis not present

## 2016-11-18 DIAGNOSIS — R0609 Other forms of dyspnea: Secondary | ICD-10-CM | POA: Diagnosis not present

## 2016-11-18 DIAGNOSIS — I484 Atypical atrial flutter: Secondary | ICD-10-CM | POA: Diagnosis not present

## 2016-11-18 DIAGNOSIS — Z7901 Long term (current) use of anticoagulants: Secondary | ICD-10-CM | POA: Diagnosis not present

## 2016-11-18 DIAGNOSIS — I420 Dilated cardiomyopathy: Secondary | ICD-10-CM | POA: Diagnosis not present

## 2016-11-18 DIAGNOSIS — Z8679 Personal history of other diseases of the circulatory system: Secondary | ICD-10-CM | POA: Diagnosis not present

## 2016-11-18 DIAGNOSIS — I480 Paroxysmal atrial fibrillation: Secondary | ICD-10-CM | POA: Diagnosis not present

## 2016-11-18 DIAGNOSIS — R0600 Dyspnea, unspecified: Secondary | ICD-10-CM | POA: Diagnosis not present

## 2016-11-18 DIAGNOSIS — I471 Supraventricular tachycardia: Secondary | ICD-10-CM | POA: Diagnosis not present

## 2016-11-18 DIAGNOSIS — Z95810 Presence of automatic (implantable) cardiac defibrillator: Secondary | ICD-10-CM | POA: Diagnosis not present

## 2016-11-22 DIAGNOSIS — I255 Ischemic cardiomyopathy: Secondary | ICD-10-CM | POA: Diagnosis not present

## 2016-11-22 DIAGNOSIS — Z4502 Encounter for adjustment and management of automatic implantable cardiac defibrillator: Secondary | ICD-10-CM | POA: Diagnosis not present

## 2017-01-28 ENCOUNTER — Telehealth: Payer: Self-pay

## 2017-01-28 NOTE — Telephone Encounter (Signed)
Fayrene Fearing, Lindsey Reynolds's husband, called and would like to get a prescription for a stair chair lift. He has already requested on from Dr Denyse Amass and was advised to schedule a visit.

## 2017-01-29 NOTE — Telephone Encounter (Signed)
Usually equipment like this requires more then just a prescription. Please ask him to contact his insurer and see what the process requires. Usually it will need an office visit to document physical exam that determines need for this equipment.

## 2017-02-01 NOTE — Telephone Encounter (Signed)
I don't know anything about obtaining tax reduction with a stair chair lift. Any paperwork they need, would recommend they schedule an appointment to explain to me exactly what is needed and I can do a physical exam to document appropriate medical need.

## 2017-02-01 NOTE — Telephone Encounter (Signed)
Her husband states it isn't for insurance but for tax reduction.

## 2017-02-02 NOTE — Telephone Encounter (Signed)
Patient's husband advised 

## 2017-02-02 NOTE — Telephone Encounter (Signed)
Left message for a call back.

## 2017-02-03 ENCOUNTER — Other Ambulatory Visit: Payer: Self-pay

## 2017-02-14 ENCOUNTER — Encounter: Payer: Self-pay | Admitting: Osteopathic Medicine

## 2017-02-14 ENCOUNTER — Ambulatory Visit (INDEPENDENT_AMBULATORY_CARE_PROVIDER_SITE_OTHER): Payer: Medicare Other | Admitting: Osteopathic Medicine

## 2017-02-14 VITALS — BP 140/80 | HR 74 | Wt 168.0 lb

## 2017-02-14 DIAGNOSIS — Z780 Asymptomatic menopausal state: Secondary | ICD-10-CM | POA: Diagnosis not present

## 2017-02-14 DIAGNOSIS — I482 Chronic atrial fibrillation, unspecified: Secondary | ICD-10-CM

## 2017-02-14 DIAGNOSIS — I10 Essential (primary) hypertension: Secondary | ICD-10-CM | POA: Diagnosis not present

## 2017-02-14 DIAGNOSIS — I4892 Unspecified atrial flutter: Secondary | ICD-10-CM | POA: Diagnosis not present

## 2017-02-14 DIAGNOSIS — Z95810 Presence of automatic (implantable) cardiac defibrillator: Secondary | ICD-10-CM

## 2017-02-14 DIAGNOSIS — N3941 Urge incontinence: Secondary | ICD-10-CM | POA: Diagnosis not present

## 2017-02-14 MED ORDER — CARVEDILOL 6.25 MG PO TABS: 6.2500 mg | ORAL_TABLET | Freq: Two times a day (BID) | ORAL | 3 refills | Status: DC

## 2017-02-14 MED ORDER — ESTROGENS CONJUGATED 0.625 MG PO TABS: ORAL_TABLET | ORAL | 3 refills | Status: DC

## 2017-02-14 NOTE — Progress Notes (Signed)
HPI: Lindsey Reynolds is a 83 y.o. female who  has a past medical history of Aortic valve regurgitation (06/05/2014), Atrial fibrillation (HCC) (06/05/2014), Congestive heart failure (HCC) (06/05/2014), and LBBB (left bundle branch block) (06/05/2014).  she presents to [facility] today, 02/14/17,  for chief complaint of:  Chief Complaint  Patient presents with  . Hypertension    HTN:  BP drops and she is concerned about falling. She went down on her carvedilol to 6.25 mg.  Has been feeling better since she has decreased the medication.  No increase in palpitations.  Afib/flutter & other cardiac: Following with cardiology/electrophysiology.  Patient is currently on anticoagulation, no complaints.  Greatest issue is easy fatigue.  She has some questions about whether or not she should still be taking Lasix,.  On record review cardiology had this listed as discontinued but she states she takes it every few weeks or so as needed for swelling.  She recently had a stair lift installed in her house and requests letter today confirming medical need for this so that she can be exempted from the sales tax.    Incontinence: Stable, has been doing a lot better with timed voiding practices and [place] exercises.    Past medical, surgical, social and family history reviewed:  Patient Active Problem List   Diagnosis Date Noted  . Vitamin D deficiency 12/31/2015  . S/P ablation of atrial fibrillation 08/19/2015  . Atrial tachycardia (HCC) 07/31/2015  . Atrial flutter (HCC) 07/15/2015  . Biventricular ICD (implantable cardioverter-defibrillator) in place 12/03/2014  . Essential hypertension 09/11/2014  . Mitral valve regurgitation 06/13/2014  . Atrial fibrillation (HCC) 06/05/2014  . Aortic valve regurgitation 06/05/2014  . LBBB (left bundle branch block) 06/05/2014  . Congestive heart failure (HCC) 06/05/2014  . Menopause 06/05/2014  . Dilated cardiomyopathy (HCC)  10/18/2011    Past Surgical History:  Procedure Laterality Date  . ABDOMINAL HYSTERECTOMY    . BIV ICD GENERTAOR CHANGE OUT    . cardoversion     x 2 2015  . GALLBLADDER SURGERY     20 years ago  . placement of defibrillator     2012    Social History   Tobacco Use  . Smoking status: Never Smoker  . Smokeless tobacco: Never Used  Substance Use Topics  . Alcohol use: Yes    Alcohol/week: 0.0 oz    No family history on file.   Current medication list and allergy/intolerance information reviewed:    Current Outpatient Medications  Medication Sig Dispense Refill  . carvedilol (COREG) 12.5 MG tablet Take 1 tablet (12.5 mg total) by mouth 2 (two) times daily with a meal. (Patient taking differently: Take 6.25 mg by mouth 2 (two) times daily with a meal. ) 180 tablet 1  . cholecalciferol (VITAMIN D) 1000 UNITS tablet One by mouth daily    . estrogens, conjugated, (PREMARIN) 0.625 MG tablet One by mouth daily 90 tablet 2  . Rivaroxaban (XARELTO) 15 MG TABS tablet Take 1 tablet (15 mg total) by mouth daily with supper. 30 tablet   . furosemide (LASIX) 20 MG tablet Take 20 mg by mouth daily.      No current facility-administered medications for this visit.     Allergies  Allergen Reactions  . Amiodarone Other (See Comments)    Blurred Vision  . Atorvastatin Other (See Comments)    cardiovascular arrest  . Lisinopril     hypotension  . Rosuvastatin Other (See Comments)  cardiovascular arrest  . Statins   . Penicillins Rash and Swelling      Review of Systems:  Constitutional:  No  fever, no chills, No recent illness  Cardiac: No  chest pain, No  pressure, No palpitations, No  Orthopnea  Respiratory:  No  shortness of breath. No  Cough  Gastrointestinal: No  abdominal pain, No  nausea,   Musculoskeletal: No new myalgia/arthralgia  Genitourinary: +incontinence, No  abnormal genital bleeding, No abnormal genital discharge  Skin: No  Rash,  Neurologic: No   weakness, No  dizziness  Psychiatric: No  concerns with depression, No  concerns with anxietyproblems  Exam:  BP 140/80   Pulse 74   Wt 168 lb (76.2 kg)   BMI 27.12 kg/m   Constitutional: VS see above. General Appearance: alert, well-developed, well-nourished, NAD  Eyes: Normal lids and conjunctive, non-icteric sclera  Ears, Nose, Mouth, Throat: MMM, Normal external inspection ears/nares/mouth/lips/gums.   Neck: No masses, trachea midline.  Respiratory: Normal respiratory effort. no wheeze, no rhonchi, no rales  Cardiovascular: S1/S2 normal, no murmur, no rub/gallop auscultated. RRR. No lower extremity edema.   Musculoskeletal: Gait normal. No clubbing/cyanosis of digits.   Neurological: Normal balance/coordination. No tremor.     Psychiatric: Normal judgment/insight. Normal mood and affect. Oriented x3.      ASSESSMENT/PLAN:    Chronic atrial fibrillation (HCC) - Doing well on current medication regimen, okay to continue and follow-up with cardiology as directed - Plan: COMPLETE METABOLIC PANEL WITH GFR  Menopause - Plan: estrogens, conjugated, (PREMARIN) 0.625 MG tablet  Essential hypertension - A bit above goal but patient feels better with a bit higher blood pressure, okay to continue lower dose Coreg, caution with as needed use Lasix  Biventricular ICD (implantable cardioverter-defibrillator) in place  Urgency incontinence  Atrial flutter, unspecified type [facility](HCC)     Visit summary with medication list and pertinent instructions was printed for patient to review. All questions at time of visit were answered - patient instructed to contact office with any additional concerns. ER/RTC precautions were reviewed with the patient.   Follow-up plan: Return in about 6 months (around 08/15/2017) for MEDICARE WELLNESS VISIT - sooner if needed .  Note: Total time spent 25 minutes, greater than 50% of the visit was spent face-to-face counseling and coordinating care for the  following: The primary encounter diagnosis was Chronic atrial fibrillation (HCC). Diagnoses of Menopause, Essential hypertension, Biventricular ICD (implantable cardioverter-defibrillator) in place, Urgency incontinence, and Atrial flutter, unspecified type [facility](HCC) were also pertinent to this visit.Marland Kitchen.  Please note: voice recognition software was used to produce this document, and typos may escape review. Please contact Dr. Lyn HollingsheadAlexander for any needed clarifications.

## 2017-02-23 DIAGNOSIS — I509 Heart failure, unspecified: Secondary | ICD-10-CM | POA: Diagnosis not present

## 2017-02-23 DIAGNOSIS — Z95810 Presence of automatic (implantable) cardiac defibrillator: Secondary | ICD-10-CM | POA: Diagnosis not present

## 2017-02-24 DIAGNOSIS — I484 Atypical atrial flutter: Secondary | ICD-10-CM | POA: Diagnosis not present

## 2017-02-24 DIAGNOSIS — Z880 Allergy status to penicillin: Secondary | ICD-10-CM | POA: Diagnosis not present

## 2017-02-24 DIAGNOSIS — Z7902 Long term (current) use of antithrombotics/antiplatelets: Secondary | ICD-10-CM | POA: Diagnosis not present

## 2017-02-24 DIAGNOSIS — I255 Ischemic cardiomyopathy: Secondary | ICD-10-CM | POA: Diagnosis not present

## 2017-02-24 DIAGNOSIS — Z9889 Other specified postprocedural states: Secondary | ICD-10-CM | POA: Diagnosis not present

## 2017-02-24 DIAGNOSIS — I509 Heart failure, unspecified: Secondary | ICD-10-CM | POA: Diagnosis not present

## 2017-02-24 DIAGNOSIS — I110 Hypertensive heart disease with heart failure: Secondary | ICD-10-CM | POA: Diagnosis not present

## 2017-02-24 DIAGNOSIS — Z888 Allergy status to other drugs, medicaments and biological substances status: Secondary | ICD-10-CM | POA: Diagnosis not present

## 2017-02-24 DIAGNOSIS — Z886 Allergy status to analgesic agent status: Secondary | ICD-10-CM | POA: Diagnosis not present

## 2017-02-24 DIAGNOSIS — Z8673 Personal history of transient ischemic attack (TIA), and cerebral infarction without residual deficits: Secondary | ICD-10-CM | POA: Diagnosis not present

## 2017-02-24 DIAGNOSIS — Z4502 Encounter for adjustment and management of automatic implantable cardiac defibrillator: Secondary | ICD-10-CM | POA: Diagnosis not present

## 2017-02-24 DIAGNOSIS — I2510 Atherosclerotic heart disease of native coronary artery without angina pectoris: Secondary | ICD-10-CM | POA: Diagnosis not present

## 2017-02-24 DIAGNOSIS — Z95810 Presence of automatic (implantable) cardiac defibrillator: Secondary | ICD-10-CM | POA: Diagnosis not present

## 2017-02-24 DIAGNOSIS — Z7901 Long term (current) use of anticoagulants: Secondary | ICD-10-CM | POA: Diagnosis not present

## 2017-02-24 DIAGNOSIS — Z79899 Other long term (current) drug therapy: Secondary | ICD-10-CM | POA: Diagnosis not present

## 2017-02-24 DIAGNOSIS — I420 Dilated cardiomyopathy: Secondary | ICD-10-CM | POA: Diagnosis not present

## 2017-02-24 DIAGNOSIS — I482 Chronic atrial fibrillation: Secondary | ICD-10-CM | POA: Diagnosis not present

## 2017-05-26 DIAGNOSIS — I482 Chronic atrial fibrillation: Secondary | ICD-10-CM | POA: Diagnosis not present

## 2017-05-26 DIAGNOSIS — I420 Dilated cardiomyopathy: Secondary | ICD-10-CM | POA: Diagnosis not present

## 2017-05-26 DIAGNOSIS — Z7901 Long term (current) use of anticoagulants: Secondary | ICD-10-CM | POA: Diagnosis not present

## 2017-05-26 DIAGNOSIS — Z95810 Presence of automatic (implantable) cardiac defibrillator: Secondary | ICD-10-CM | POA: Diagnosis not present

## 2017-05-26 DIAGNOSIS — Z9889 Other specified postprocedural states: Secondary | ICD-10-CM | POA: Diagnosis not present

## 2017-06-06 DIAGNOSIS — I255 Ischemic cardiomyopathy: Secondary | ICD-10-CM | POA: Diagnosis not present

## 2017-06-06 DIAGNOSIS — Z4502 Encounter for adjustment and management of automatic implantable cardiac defibrillator: Secondary | ICD-10-CM | POA: Diagnosis not present

## 2017-06-20 DIAGNOSIS — I482 Chronic atrial fibrillation: Secondary | ICD-10-CM | POA: Diagnosis not present

## 2017-06-20 DIAGNOSIS — I083 Combined rheumatic disorders of mitral, aortic and tricuspid valves: Secondary | ICD-10-CM | POA: Diagnosis not present

## 2017-06-20 DIAGNOSIS — I484 Atypical atrial flutter: Secondary | ICD-10-CM | POA: Diagnosis not present

## 2017-07-04 DIAGNOSIS — I255 Ischemic cardiomyopathy: Secondary | ICD-10-CM | POA: Diagnosis not present

## 2017-07-04 DIAGNOSIS — I340 Nonrheumatic mitral (valve) insufficiency: Secondary | ICD-10-CM | POA: Diagnosis not present

## 2017-07-04 DIAGNOSIS — Z9889 Other specified postprocedural states: Secondary | ICD-10-CM | POA: Diagnosis not present

## 2017-07-04 DIAGNOSIS — I428 Other cardiomyopathies: Secondary | ICD-10-CM | POA: Diagnosis not present

## 2017-07-04 DIAGNOSIS — Z7901 Long term (current) use of anticoagulants: Secondary | ICD-10-CM | POA: Diagnosis not present

## 2017-07-04 DIAGNOSIS — I5022 Chronic systolic (congestive) heart failure: Secondary | ICD-10-CM | POA: Diagnosis not present

## 2017-07-04 DIAGNOSIS — I482 Chronic atrial fibrillation: Secondary | ICD-10-CM | POA: Diagnosis not present

## 2017-07-04 DIAGNOSIS — I484 Atypical atrial flutter: Secondary | ICD-10-CM | POA: Diagnosis not present

## 2017-07-04 DIAGNOSIS — Z95810 Presence of automatic (implantable) cardiac defibrillator: Secondary | ICD-10-CM | POA: Diagnosis not present

## 2017-07-04 DIAGNOSIS — I110 Hypertensive heart disease with heart failure: Secondary | ICD-10-CM | POA: Diagnosis not present

## 2017-07-04 DIAGNOSIS — Z79899 Other long term (current) drug therapy: Secondary | ICD-10-CM | POA: Diagnosis not present

## 2017-08-15 ENCOUNTER — Ambulatory Visit (INDEPENDENT_AMBULATORY_CARE_PROVIDER_SITE_OTHER): Payer: Medicare Other | Admitting: Osteopathic Medicine

## 2017-08-15 ENCOUNTER — Encounter: Payer: Self-pay | Admitting: Osteopathic Medicine

## 2017-08-15 VITALS — BP 117/70 | HR 74 | Temp 97.5°F | Ht 63.75 in | Wt 155.0 lb

## 2017-08-15 DIAGNOSIS — I509 Heart failure, unspecified: Secondary | ICD-10-CM | POA: Diagnosis not present

## 2017-08-15 DIAGNOSIS — Z780 Asymptomatic menopausal state: Secondary | ICD-10-CM | POA: Diagnosis not present

## 2017-08-15 DIAGNOSIS — R7989 Other specified abnormal findings of blood chemistry: Secondary | ICD-10-CM | POA: Diagnosis not present

## 2017-08-15 DIAGNOSIS — I420 Dilated cardiomyopathy: Secondary | ICD-10-CM | POA: Diagnosis not present

## 2017-08-15 DIAGNOSIS — I351 Nonrheumatic aortic (valve) insufficiency: Secondary | ICD-10-CM | POA: Diagnosis not present

## 2017-08-15 DIAGNOSIS — Z7189 Other specified counseling: Secondary | ICD-10-CM | POA: Diagnosis not present

## 2017-08-15 DIAGNOSIS — I340 Nonrheumatic mitral (valve) insufficiency: Secondary | ICD-10-CM | POA: Diagnosis not present

## 2017-08-15 DIAGNOSIS — Z95810 Presence of automatic (implantable) cardiac defibrillator: Secondary | ICD-10-CM

## 2017-08-15 DIAGNOSIS — N184 Chronic kidney disease, stage 4 (severe): Secondary | ICD-10-CM

## 2017-08-15 DIAGNOSIS — R8290 Unspecified abnormal findings in urine: Secondary | ICD-10-CM | POA: Diagnosis not present

## 2017-08-15 DIAGNOSIS — I5022 Chronic systolic (congestive) heart failure: Secondary | ICD-10-CM

## 2017-08-15 DIAGNOSIS — G2581 Restless legs syndrome: Secondary | ICD-10-CM | POA: Diagnosis not present

## 2017-08-15 MED ORDER — ESTROGENS CONJUGATED 0.625 MG PO TABS: ORAL_TABLET | ORAL | 3 refills | Status: AC

## 2017-08-15 NOTE — Patient Instructions (Signed)
   Call you tomorrow with dose of Magnesium +/- other meds for RLS  We are rechecking kidney function since this was low - can affect meds/doses, may require specialist follow-up

## 2017-08-15 NOTE — Progress Notes (Signed)
HPI: Lindsey Reynolds is a 84 y.o. female who  has a past medical history of Aortic valve regurgitation (06/05/2014), Atrial fibrillation (HCC) (06/05/2014), Congestive heart failure (HCC) (06/05/2014), and LBBB (left bundle branch block) (06/05/2014).  she presents to [facility] today, 08/15/17,  for chief complaint of:  Discuss heart issues  Cardiology records reviewed: There has been some diminished ejection fraction noted on most recent echo, EF down to 25%.  She was advised to follow-up for nuclear scan but she decided not to pursue this, she has had the scans before and they tend to make her feel quite ill.  She does not see the utility in doing the scan if it is not going to tell the doctors anything we do not already know, or change her management in any way.  She thinks worsening in heart function was due to overhydration which was initially advised after she was having some presyncopal episodes in the fall of last year.  Her greatest physical complaint at this point is sleep issues, she is concerned about restless leg syndrome given feelings of twitchiness, inability to lie still, insomnia.  She brought this up to her cardiologist at most recent visit and was a bit upset when to her they did not seem to want to address it.  She reports that eating a banana prior to bedtime seems to help.        Past medical history, surgical history, and family history reviewed.  Current medication list and allergy/intolerance information reviewed.   (See remainder of HPI, ROS, Phys Exam below)    Pertinent results:  Echocardiogram 06/20/2017: LVEF 25%, mild concentric LVH, severe global hypokinesis of left ventricle, moderate to severe mitral calcification/regurgitation, moderate pulmonary hypertension.   Echocardiogram 05/15/2015: LVEF 45-50%   Labs from [facility], 07/04/2017: Creatinine 1.70, increased from baseline about 1.2-1.4, GFR 27   Results for orders  placed or performed in visit on 08/15/17 (from the past 72 hour(s))  CBC with Differential/Platelet     Status: None   Collection Time: 08/15/17 12:08 PM  Result Value Ref Range   WBC 7.4 3.8 - 10.8 Thousand/uL   RBC 4.84 3.80 - 5.10 Million/uL   Hemoglobin 13.6 11.7 - 15.5 g/dL   HCT 16.1 09.6 - 04.5 %   MCV 84.7 80.0 - 100.0 fL   MCH 28.1 27.0 - 33.0 pg   MCHC 33.2 32.0 - 36.0 g/dL   RDW 40.9 81.1 - 91.4 %   Platelets 267 140 - 400 Thousand/uL   MPV 9.2 7.5 - 12.5 fL   Neutro Abs 5,291 1,500 - 7,800 cells/uL   Lymphs Abs 1,169 850 - 3,900 cells/uL   WBC mixed population 666 200 - 950 cells/uL   Eosinophils Absolute 192 15 - 500 cells/uL   Basophils Absolute 81 0 - 200 cells/uL   Neutrophils Relative % 71.5 %   Total Lymphocyte 15.8 %   Monocytes Relative 9.0 %   Eosinophils Relative 2.6 %   Basophils Relative 1.1 %  COMPLETE METABOLIC PANEL WITH GFR     Status: Abnormal   Collection Time: 08/15/17 12:08 PM  Result Value Ref Range   Glucose, Bld 104 (H) 65 - 99 mg/dL    Comment: .            Fasting reference interval . For someone without known diabetes, a glucose value between 100 and 125 mg/dL is consistent with prediabetes and should be confirmed with a follow-up test. .  BUN 32 (H) 7 - 25 mg/dL   Creat 1.61 (H) 0.96 - 0.88 mg/dL    Comment: For patients >60 years of age, the reference limit for Creatinine is approximately 13% higher for people identified as African-American. .    GFR, Est Non African American 26 (L) > OR = 60 mL/min/1.72m2   GFR, Est African American 30 (L) > OR = 60 mL/min/1.85m2   BUN/Creatinine Ratio 18 6 - 22 (calc)   Sodium 139 135 - 146 mmol/L   Potassium 4.6 3.5 - 5.3 mmol/L   Chloride 102 98 - 110 mmol/L   CO2 25 20 - 32 mmol/L   Calcium 9.7 8.6 - 10.4 mg/dL   Total Protein 7.2 6.1 - 8.1 g/dL   Albumin 4.3 3.6 - 5.1 g/dL   Globulin 2.9 1.9 - 3.7 g/dL (calc)   AG Ratio 1.5 1.0 - 2.5 (calc)   Total Bilirubin 0.7 0.2 - 1.2 mg/dL    Alkaline phosphatase (APISO) 63 33 - 130 U/L   AST 16 10 - 35 U/L   ALT 9 6 - 29 U/L  Magnesium     Status: None   Collection Time: 08/15/17 12:08 PM  Result Value Ref Range   Magnesium 2.0 1.5 - 2.5 mg/dL  Phosphorus     Status: Abnormal   Collection Time: 08/15/17 12:08 PM  Result Value Ref Range   Phosphorus 4.9 (H) 2.1 - 4.3 mg/dL       ASSESSMENT/PLAN:   Chronic systolic congestive heart failure (HCC) - Discussed daily weights, fluid intake precautions - Plan: CBC with Differential/Platelet, COMPLETE METABOLIC PANEL WITH GFR, Magnesium, Phosphorus, Urinalysis, Routine w reflex microscopic, Ambulatory referral to Nephrology  Restless legs syndrome (RLS) - trial Requip given fewer renal concerns w/ this.   CKD (chronic kidney disease) stage 4, GFR 15-29 ml/min (HCC) - Marked worsening over the past year.  Concern for cardiorenal syndrome - refer to nephrology, would maintain Lasix for now - Plan: Ambulatory referral to Nephrology  Advanced directives, counseling/discussion - Resuscitative measures okay, would not want to be on life support long-term, husband is proxy  Biventricular ICD (implantable cardioverter-defibrillator) in place  Dilated cardiomyopathy (HCC)  Nonrheumatic aortic valve insufficiency  Non-rheumatic mitral regurgitation  Elevated serum creatinine - Plan: COMPLETE METABOLIC PANEL WITH GFR, Magnesium, Phosphorus  Menopause - refilled rx - Plan: estrogens, conjugated, (PREMARIN) 0.625 MG tablet   Meds ordered this encounter  Medications  . estrogens, conjugated, (PREMARIN) 0.625 MG tablet    Sig: One by mouth daily    Dispense:  90 tablet    Refill:  3    Please cancel under Dr Zollie Pee name thanks    Patient Instructions   Call you tomorrow with dose of Magnesium +/- other meds for RLS  We are rechecking kidney function since this was low - can affect meds/doses, may require specialist follow-up    Follow-up plan: Return in about 2 weeks  (around 08/29/2017) for recheck sleep, sooner if needed depending on labs/symptoms .     ############################################ ############################################ ############################################ ############################################    Outpatient Encounter Medications as of 08/15/2017  Medication Sig Note  . carvedilol (COREG) 6.25 MG tablet Take 1 tablet (6.25 mg total) by mouth 2 (two) times daily with a meal.   . cholecalciferol (VITAMIN D) 1000 UNITS tablet One by mouth daily   . estrogens, conjugated, (PREMARIN) 0.625 MG tablet One by mouth daily   . losartan (COZAAR) 25 MG tablet Take 25 mg by mouth daily.   Marland Kitchen  Rivaroxaban (XARELTO) 15 MG TABS tablet Take 1 tablet (15 mg total) by mouth daily with supper.   . furosemide (LASIX) 20 MG tablet Take 20 mg by mouth daily.  12/31/2015: Received from: [facility] Received Sig: Take 1 tablet (20 mg total) by mouth daily.   No facility-administered encounter medications on file as of 08/15/2017.    Allergies  Allergen Reactions  . Amiodarone Other (See Comments)    Blurred Vision  . Atorvastatin Other (See Comments)    cardiovascular arrest  . Lisinopril     hypotension  . Rosuvastatin Other (See Comments)    cardiovascular arrest  . Statins   . Penicillins Rash and Swelling      Review of Systems:  Constitutional: No recent illness  HEENT: No  headache, no vision change  Cardiac: No  chest pain, No  pressure, No palpitations  Respiratory:  No  shortness of breath. No  Cough  Gastrointestinal: No  abdominal pain  Musculoskeletal: No new myalgia/arthralgia  Skin: No  Rash  Neurologic: No  weakness, No  Dizziness  Psychiatric: No  concerns with depression, No  concerns with anxiety, +sleep issues  Exam:  BP 117/70   Pulse 74   Temp (!) 97.5 F (36.4 C) (Oral)   Ht 5' 3.75" (1.619 m)   Wt 155 lb (70.3 kg)   BMI 26.81 kg/m   Constitutional: VS see above.  General Appearance: alert, well-developed, well-nourished, NAD  Eyes: Normal lids and conjunctive, non-icteric sclera  Ears, Nose, Mouth, Throat: MMM, Normal external inspection ears/nares/mouth/lips/gums.  Neck: No masses, trachea midline.   Respiratory: Normal respiratory effort. no wheeze, no rhonchi, no rales  Cardiovascular: S1/S2 normal, + murmur, no rub/gallop auscultated. RRR.   Musculoskeletal: Gait normal. Symmetric and independent movement of all extremities  Neurological: Normal balance/coordination. No tremor.  Skin: warm, dry, intact.   Psychiatric: Normal judgment/insight. Normal mood and affect. Oriented x3.   Visit summary with medication list and pertinent instructions was printed for patient to review, advised to alert [place] if any changes needed. All questions at time of visit were answered - patient instructed to contact office with any additional concerns. ER/RTC precautions were reviewed with the patient and understanding verbalized.   Follow-up plan: Return in about 2 weeks (around 08/29/2017) for recheck sleep, sooner if needed depending on labs/symptoms .  Note: Total time spent 40 minutes, greater than 50% of the visit was spent face-to-face counseling and coordinating care for the following: The primary encounter diagnosis was Chronic systolic congestive heart failure (HCC). Diagnoses of Restless legs syndrome (RLS), CKD (chronic kidney disease) stage 4, GFR 15-29 ml/min (HCC), Advanced directives, counseling/discussion, Biventricular ICD (implantable cardioverter-defibrillator) in place, Dilated cardiomyopathy (HCC), Nonrheumatic aortic valve insufficiency, Non-rheumatic mitral regurgitation, Elevated serum creatinine, and Menopause were also pertinent to this visit.Marland Kitchen  Please note: voice recognition software was used to produce this document, and typos may escape review. Please contact Dr. Lyn Hollingshead for any needed clarifications.

## 2017-08-16 ENCOUNTER — Encounter: Payer: Self-pay | Admitting: Osteopathic Medicine

## 2017-08-16 ENCOUNTER — Other Ambulatory Visit: Payer: Self-pay | Admitting: Osteopathic Medicine

## 2017-08-16 DIAGNOSIS — G2581 Restless legs syndrome: Secondary | ICD-10-CM

## 2017-08-16 DIAGNOSIS — N184 Chronic kidney disease, stage 4 (severe): Secondary | ICD-10-CM | POA: Insufficient documentation

## 2017-08-16 LAB — CBC WITH DIFFERENTIAL/PLATELET
BASOS PCT: 1.1 %
Basophils Absolute: 81 cells/uL (ref 0–200)
EOS PCT: 2.6 %
Eosinophils Absolute: 192 cells/uL (ref 15–500)
HCT: 41 % (ref 35.0–45.0)
Hemoglobin: 13.6 g/dL (ref 11.7–15.5)
LYMPHS ABS: 1169 {cells}/uL (ref 850–3900)
MCH: 28.1 pg (ref 27.0–33.0)
MCHC: 33.2 g/dL (ref 32.0–36.0)
MCV: 84.7 fL (ref 80.0–100.0)
MPV: 9.2 fL (ref 7.5–12.5)
Monocytes Relative: 9 %
NEUTROS PCT: 71.5 %
Neutro Abs: 5291 cells/uL (ref 1500–7800)
PLATELETS: 267 10*3/uL (ref 140–400)
RBC: 4.84 10*6/uL (ref 3.80–5.10)
RDW: 14.7 % (ref 11.0–15.0)
TOTAL LYMPHOCYTE: 15.8 %
WBC: 7.4 10*3/uL (ref 3.8–10.8)
WBCMIX: 666 {cells}/uL (ref 200–950)

## 2017-08-16 LAB — COMPLETE METABOLIC PANEL WITH GFR
AG Ratio: 1.5 (calc) (ref 1.0–2.5)
ALT: 9 U/L (ref 6–29)
AST: 16 U/L (ref 10–35)
Albumin: 4.3 g/dL (ref 3.6–5.1)
Alkaline phosphatase (APISO): 63 U/L (ref 33–130)
BUN/Creatinine Ratio: 18 (calc) (ref 6–22)
BUN: 32 mg/dL — ABNORMAL HIGH (ref 7–25)
CALCIUM: 9.7 mg/dL (ref 8.6–10.4)
CO2: 25 mmol/L (ref 20–32)
Chloride: 102 mmol/L (ref 98–110)
Creat: 1.8 mg/dL — ABNORMAL HIGH (ref 0.60–0.88)
GFR, EST AFRICAN AMERICAN: 30 mL/min/{1.73_m2} — AB (ref >=60)
GFR, EST NON AFRICAN AMERICAN: 26 mL/min/{1.73_m2} — AB (ref >=60)
Globulin: 2.9 g/dL (calc) (ref 1.9–3.7)
Glucose, Bld: 104 mg/dL — ABNORMAL HIGH (ref 65–99)
Potassium: 4.6 mmol/L (ref 3.5–5.3)
Sodium: 139 mmol/L (ref 135–146)
TOTAL PROTEIN: 7.2 g/dL (ref 6.1–8.1)
Total Bilirubin: 0.7 mg/dL (ref 0.2–1.2)

## 2017-08-16 LAB — URINALYSIS, ROUTINE W REFLEX MICROSCOPIC
Bilirubin Urine: NEGATIVE
Glucose, UA: NEGATIVE
Hgb urine dipstick: NEGATIVE
NITRITE: NEGATIVE
Protein, ur: NEGATIVE
SPECIFIC GRAVITY, URINE: 1.019 (ref 1.001–1.03)
pH: <=5 (ref 5.0–8.0)

## 2017-08-16 LAB — PHOSPHORUS: PHOSPHORUS: 4.9 mg/dL — AB (ref 2.1–4.3)

## 2017-08-16 LAB — MAGNESIUM: Magnesium: 2 mg/dL (ref 1.5–2.5)

## 2017-08-16 MED ORDER — ROPINIROLE HCL 0.25 MG PO TABS: ORAL_TABLET | ORAL | 0 refills | Status: DC

## 2017-08-16 NOTE — Addendum Note (Signed)
Addended by: Deirdre Pippins on: 08/16/2017 12:55 PM   Modules accepted: Orders

## 2017-08-26 DIAGNOSIS — Z4502 Encounter for adjustment and management of automatic implantable cardiac defibrillator: Secondary | ICD-10-CM | POA: Diagnosis not present

## 2017-08-26 DIAGNOSIS — I255 Ischemic cardiomyopathy: Secondary | ICD-10-CM | POA: Diagnosis not present

## 2017-08-28 DIAGNOSIS — I255 Ischemic cardiomyopathy: Secondary | ICD-10-CM | POA: Diagnosis not present

## 2017-08-28 DIAGNOSIS — Z4502 Encounter for adjustment and management of automatic implantable cardiac defibrillator: Secondary | ICD-10-CM | POA: Diagnosis not present

## 2017-11-02 ENCOUNTER — Ambulatory Visit (INDEPENDENT_AMBULATORY_CARE_PROVIDER_SITE_OTHER): Payer: Medicare Other | Admitting: Osteopathic Medicine

## 2017-11-02 VITALS — BP 118/66 | HR 70 | Wt 156.0 lb

## 2017-11-02 DIAGNOSIS — Z23 Encounter for immunization: Secondary | ICD-10-CM

## 2017-11-02 NOTE — Progress Notes (Signed)
   Subjective:    Patient ID: Lindsey Reynolds, female    DOB: 01/03/1932, 84 y.o.   MRN: [id number]  HPI  Lindsey Reynolds is here for flu vaccine and Prevnar 13 vaccine.   Review of Systems     Objective:   Physical Exam        Assessment & Plan:  Patient tolerated injection well without complications.

## 2017-11-21 DIAGNOSIS — Z4502 Encounter for adjustment and management of automatic implantable cardiac defibrillator: Secondary | ICD-10-CM | POA: Diagnosis not present

## 2017-11-21 DIAGNOSIS — I255 Ischemic cardiomyopathy: Secondary | ICD-10-CM | POA: Diagnosis not present

## 2017-11-24 DIAGNOSIS — Z79899 Other long term (current) drug therapy: Secondary | ICD-10-CM | POA: Diagnosis not present

## 2017-11-24 DIAGNOSIS — Z7901 Long term (current) use of anticoagulants: Secondary | ICD-10-CM | POA: Diagnosis not present

## 2017-11-24 DIAGNOSIS — I471 Supraventricular tachycardia: Secondary | ICD-10-CM | POA: Diagnosis not present

## 2017-11-24 DIAGNOSIS — I484 Atypical atrial flutter: Secondary | ICD-10-CM | POA: Diagnosis not present

## 2017-11-24 DIAGNOSIS — I5022 Chronic systolic (congestive) heart failure: Secondary | ICD-10-CM | POA: Diagnosis not present

## 2017-11-24 DIAGNOSIS — Z95810 Presence of automatic (implantable) cardiac defibrillator: Secondary | ICD-10-CM | POA: Diagnosis not present

## 2017-12-09 ENCOUNTER — Encounter: Payer: Self-pay | Admitting: Family Medicine

## 2017-12-09 ENCOUNTER — Ambulatory Visit (INDEPENDENT_AMBULATORY_CARE_PROVIDER_SITE_OTHER): Payer: Medicare Other | Admitting: Family Medicine

## 2017-12-09 VITALS — BP 122/83 | HR 73

## 2017-12-09 DIAGNOSIS — Z7901 Long term (current) use of anticoagulants: Secondary | ICD-10-CM | POA: Diagnosis not present

## 2017-12-09 DIAGNOSIS — I484 Atypical atrial flutter: Secondary | ICD-10-CM | POA: Diagnosis not present

## 2017-12-09 DIAGNOSIS — Z880 Allergy status to penicillin: Secondary | ICD-10-CM | POA: Diagnosis not present

## 2017-12-09 DIAGNOSIS — I4821 Permanent atrial fibrillation: Secondary | ICD-10-CM | POA: Diagnosis not present

## 2017-12-09 DIAGNOSIS — K921 Melena: Secondary | ICD-10-CM

## 2017-12-09 DIAGNOSIS — D649 Anemia, unspecified: Secondary | ICD-10-CM | POA: Diagnosis not present

## 2017-12-09 DIAGNOSIS — Z90710 Acquired absence of both cervix and uterus: Secondary | ICD-10-CM | POA: Diagnosis not present

## 2017-12-09 DIAGNOSIS — D500 Iron deficiency anemia secondary to blood loss (chronic): Secondary | ICD-10-CM | POA: Diagnosis not present

## 2017-12-09 DIAGNOSIS — I5022 Chronic systolic (congestive) heart failure: Secondary | ICD-10-CM | POA: Diagnosis present

## 2017-12-09 DIAGNOSIS — I2510 Atherosclerotic heart disease of native coronary artery without angina pectoris: Secondary | ICD-10-CM | POA: Diagnosis present

## 2017-12-09 DIAGNOSIS — I110 Hypertensive heart disease with heart failure: Secondary | ICD-10-CM | POA: Diagnosis present

## 2017-12-09 DIAGNOSIS — K3189 Other diseases of stomach and duodenum: Secondary | ICD-10-CM | POA: Diagnosis not present

## 2017-12-09 DIAGNOSIS — K922 Gastrointestinal hemorrhage, unspecified: Secondary | ICD-10-CM | POA: Diagnosis not present

## 2017-12-09 DIAGNOSIS — Z888 Allergy status to other drugs, medicaments and biological substances status: Secondary | ICD-10-CM | POA: Diagnosis not present

## 2017-12-09 DIAGNOSIS — D62 Acute posthemorrhagic anemia: Secondary | ICD-10-CM | POA: Diagnosis present

## 2017-12-09 DIAGNOSIS — I428 Other cardiomyopathies: Secondary | ICD-10-CM | POA: Diagnosis present

## 2017-12-09 DIAGNOSIS — R5383 Other fatigue: Secondary | ICD-10-CM

## 2017-12-09 DIAGNOSIS — Z79899 Other long term (current) drug therapy: Secondary | ICD-10-CM | POA: Diagnosis not present

## 2017-12-09 DIAGNOSIS — Z8673 Personal history of transient ischemic attack (TIA), and cerebral infarction without residual deficits: Secondary | ICD-10-CM | POA: Diagnosis not present

## 2017-12-09 DIAGNOSIS — Z9049 Acquired absence of other specified parts of digestive tract: Secondary | ICD-10-CM | POA: Diagnosis not present

## 2017-12-09 DIAGNOSIS — I4891 Unspecified atrial fibrillation: Secondary | ICD-10-CM | POA: Diagnosis not present

## 2017-12-09 DIAGNOSIS — Z886 Allergy status to analgesic agent status: Secondary | ICD-10-CM | POA: Diagnosis not present

## 2017-12-09 DIAGNOSIS — Z95810 Presence of automatic (implantable) cardiac defibrillator: Secondary | ICD-10-CM | POA: Diagnosis not present

## 2017-12-09 DIAGNOSIS — K5710 Diverticulosis of small intestine without perforation or abscess without bleeding: Secondary | ICD-10-CM | POA: Diagnosis not present

## 2017-12-09 LAB — POCT HEMOGLOBIN: Hemoglobin: 6 g/dL — AB (ref 12.2–16.2)

## 2017-12-09 NOTE — Progress Notes (Signed)
Lindsey Reynolds is a 84 y.o. female who presents to [facility] today for fatigue.  Lindsey Reynolds notes new onset fatigue occurring over the last few days.  She notes that she has been having melanotic stools and diarrhea recently.  She is concerned that she may have developed a GI bleed.  She has a pertinent history of significant heart failure with an ejection fraction of 25%.  Her cardiology care is at [facility] and she would like if she has to go to the emergency room to go to [facility].  She denies a personal history of GI bleed.  No fevers chills vomiting.  She notes significant fatigue with exertion.  She denies current chest pain or current rapid palpitations.   ROS as above: No headache, visual changes, nausea, vomiting, , constipation, , abdominal pain, skin rash, fevers, chills, night sweats, weight loss, swollen lymph nodes, body aches, joint swelling, muscle aches, , mood changes, visual or auditory hallucinations.    Exam:  BP 122/83   Pulse 73  Wt Readings from Last 5 Encounters:  11/02/17 156 lb (70.8 kg)  08/15/17 155 lb (70.3 kg)  02/14/17 168 lb (76.2 kg)  08/16/16 165 lb (74.8 kg)  03/10/16 171 lb (77.6 kg)    Gen: Ill-appearing HEENT: EOMI,  MMM Lungs: Normal work of breathing.  Crackles bilateral lung fields Heart: RRR no MRG Abd: NABS, Soft. Nondistended, Nontender Exts: Brisk capillary refill, warm and well perfused.  Trace edema bilateral extremities  Lab and Radiology Results Results for orders placed or performed in visit on 12/09/17 (from the past 72 hour(s))  POCT hemoglobin     Status: Abnormal   Collection Time: 12/09/17 10:07 AM  Result Value Ref Range   Hemoglobin 6.0 (A) 12.2 - 16.2 g/dL   Lab Results  Component Value Date   WBC 7.4 08/15/2017   HGB 6.0 (A) 12/09/2017   HCT 41.0 08/15/2017   MCV 84.7 08/15/2017     PLT 267 08/15/2017     Chemistry      Component Value Date/Time   NA 139 08/15/2017 1208   NA 137 01/30/2013   K 4.6 08/15/2017 1208   CL 102 08/15/2017 1208   CO2 25 08/15/2017 1208   BUN 32 (H) 08/15/2017 1208   CREATININE 1.80 (H) 08/15/2017 1208   GLU 85 01/30/2013      Component Value Date/Time   CALCIUM 9.7 08/15/2017 1208   ALKPHOS 49 03/04/2016 0938   AST 16 08/15/2017 1208   ALT 9 08/15/2017 1208   BILITOT 0.7 08/15/2017 1208     Hemoglobin was last measured at 13.04 August 2017    Assessment and Plan: 84 y.o. female with  New onset melanotic stools with fatigue.  Point of care hemoglobin evaluation in office today shows low at 6.0.  This is significantly abnormal from her baseline of around 13 in June 2019.  Given her heart failure history and obvious change in health I believe it is reasonable for her to go directly to the emergency room for evaluation.  I expect she will likely receive blood transfusion and GI evaluation.  I am hopeful that cardiology will also be consulted given her significant heart history.   Orders Placed This Encounter  Procedures  . POCT hemoglobin   No orders of the defined types were placed in this encounter.    Historical information moved to improve visibility of documentation.  Past Medical History:  Diagnosis Date  . Aortic valve regurgitation 06/05/2014  . Atrial fibrillation (HCC) 06/05/2014  . Congestive heart failure (HCC) 06/05/2014  . LBBB (left bundle branch block) 06/05/2014   Past Surgical History:  Procedure Laterality Date  . ABDOMINAL HYSTERECTOMY    . BIV ICD GENERTAOR CHANGE OUT    . cardoversion     x 2 2015  . GALLBLADDER SURGERY     20 years ago  . placement of defibrillator     2012   Social History   Tobacco Use  . Smoking status: Never Smoker  . Smokeless tobacco: Never Used  Substance Use Topics  . Alcohol use: Yes    Alcohol/week: 0.0 standard drinks   family history is not on  file.  Medications: Current Outpatient Medications  Medication Sig Dispense Refill  . carvedilol (COREG) 6.25 MG tablet Take 1 tablet (6.25 mg total) by mouth 2 (two) times daily with a meal. 180 tablet 3  . cholecalciferol (VITAMIN D) 1000 UNITS tablet One by mouth daily    . estrogens, conjugated, (PREMARIN) 0.625 MG tablet One by mouth daily 90 tablet 3  . furosemide (LASIX) 20 MG tablet Take 20 mg by mouth daily.     Marland Kitchen losartan (COZAAR) 25 MG tablet Take 25 mg by mouth daily.    . Rivaroxaban (XARELTO) 15 MG TABS tablet Take 1 tablet (15 mg total) by mouth daily with supper. 30 tablet   . rOPINIRole (REQUIP) 0.25 MG tablet Take 1 tablet (0.25 mg total) by mouth at bedtime for 7 days, THEN 2 tablets (0.5 mg total) at bedtime for 23 days. Take 1-3 hrs before bed. 53 tablet 0   No current facility-administered medications for this visit.    Allergies  Allergen Reactions  . Amiodarone Other (See Comments)    Blurred Vision  . Atorvastatin Other (See Comments)    cardiovascular arrest  . Lisinopril     hypotension  . Rosuvastatin Other (See Comments)    cardiovascular arrest  . Statins   . Penicillins Rash and Swelling     Discussed warning signs or symptoms. Please see discharge instructions. Patient expresses understanding.

## 2017-12-09 NOTE — Patient Instructions (Signed)
Go directly to [facility] ED.  Recheck with me as needed.

## 2017-12-11 LAB — BASIC METABOLIC PANEL
BUN: 24 — AB (ref 4–21)
Creatinine: 1.1 (ref 0.5–1.1)
Potassium: 3.7 (ref 3.4–5.3)
SODIUM: 138 (ref 137–147)

## 2017-12-11 LAB — HEPATIC FUNCTION PANEL
ALT: 6 — AB (ref 7–35)
AST: 11 — AB (ref 13–35)
Alkaline Phosphatase: 40 (ref 25–125)
BILIRUBIN, TOTAL: 0.4

## 2017-12-20 ENCOUNTER — Encounter: Payer: Self-pay | Admitting: Family Medicine

## 2017-12-20 ENCOUNTER — Ambulatory Visit (INDEPENDENT_AMBULATORY_CARE_PROVIDER_SITE_OTHER): Payer: Medicare Other | Admitting: Family Medicine

## 2017-12-20 ENCOUNTER — Ambulatory Visit (INDEPENDENT_AMBULATORY_CARE_PROVIDER_SITE_OTHER): Payer: Medicare Other

## 2017-12-20 VITALS — BP 120/60 | HR 73

## 2017-12-20 DIAGNOSIS — I6350 Cerebral infarction due to unspecified occlusion or stenosis of unspecified cerebral artery: Secondary | ICD-10-CM

## 2017-12-20 DIAGNOSIS — K921 Melena: Secondary | ICD-10-CM | POA: Diagnosis not present

## 2017-12-20 DIAGNOSIS — I739 Peripheral vascular disease, unspecified: Secondary | ICD-10-CM | POA: Diagnosis not present

## 2017-12-20 DIAGNOSIS — H53133 Sudden visual loss, bilateral: Secondary | ICD-10-CM

## 2017-12-20 DIAGNOSIS — H547 Unspecified visual loss: Secondary | ICD-10-CM | POA: Diagnosis not present

## 2017-12-20 DIAGNOSIS — D649 Anemia, unspecified: Secondary | ICD-10-CM

## 2017-12-20 DIAGNOSIS — I6389 Other cerebral infarction: Secondary | ICD-10-CM | POA: Diagnosis not present

## 2017-12-20 LAB — MAGNESIUM: Magnesium: 1.8 mg/dL (ref 1.5–2.5)

## 2017-12-20 NOTE — Patient Instructions (Signed)
Thank you for coming in today. Get labs and CT scan now.  Recheck soon.   Schedule with Dr Lyn Hollingshead on Friday at 810 (arrive early).  If we can do a transfusion we may consider it.  We also may consider hospice.

## 2017-12-20 NOTE — Progress Notes (Signed)
Lindsey Reynolds is a 84 y.o. female who presents to [facility] today for fatigue and vision loss.  Lindsey Reynolds was recently seen in the emergency department admitted for significant anemia.  Her hemoglobin was less than 7 and she received 1 unit of packed red blood cells getting her hemoglobin at discharge up to about 7.9.  She had an upper endoscopy which did not show any obvious source of bleeding and had a CT scan of her abdomen and pelvis which also did not show obvious bleeding source.  She notes that on discharge she continues to feel fatigued.  However most alarmingly she notes that following her upper endoscopy she had significant loss of vision.  She notes that she is able to make out dark and light in some details but notes significant change in visual acuity.  This is been stable for about a month and not changing.  She feels ill and fatigued.  She would like very much to avoid going back to the emergency room and is considering more palliative approach.  Following discharge her Xarelto and Lasix were held.  She has not restarted these medications.  Her medical history as noted on last notes is significant for for significant heart failure and block with pacer defibrillator.  ROS as above: No ,vomiting, diarrhea, constipation, , abdominal pain, skin rash, fevers, chills, night sweats,, swollen lymph nodes, body aches, joint swelling, muscle aches, chest pain, , mood changes, visual or auditory hallucinations.   She notes headache visual field loss fatigue nausea body aches.  She denies severe shortness of breath or chest pains.  Exam:  BP 120/60   Pulse 73  Wt Readings from Last 5 Encounters:  11/02/17 156 lb (70.8 kg)  08/15/17 155 lb (70.3 kg)  02/14/17 168 lb (76.2 kg)  08/16/16 165 lb (74.8 kg)  03/10/16 171 lb (77.6 kg)    Gen: Well NAD HEENT: EOMI,  MMM  pupils are equal and round responsive to light.  Retina visible bilateral eyes.  Patient has significant lack of visual acuity bilaterally.  Visual fields are also diminished bilaterally. Lungs: Normal work of breathing. CTABL Heart: Heart rate reasonably regular.   heart sound difficult to appreciate but present with murmur present as well. Abd: NABS,  difficult toSoft. Nondistended, Nontender Exts: Brisk capillary refill, warm and well perfused.  Neuro alert and oriented normal coordination and speech and thought process.  Lab and Radiology Results CBC:  Recent Labs  Lab 12/09/17 1139 12/09/17 1934 12/10/17 0623 12/11/17 0649  WBC 9.6 9.0 7.8 6.7  HGB 7.9* 6.9* 7.8* 7.5*  HCT 22.9* 19.8* 22.3* 21.5*  PLT 279 234 203 206  and CMP:  Recent Labs  Lab 12/09/17 1139 12/09/17 1934 12/10/17 0623 12/11/17 0649  NA 139 138 140 138  K 3.8 3.8 3.8 3.7  CL 106 107 107 109  CO2 24 26 24 23   BUN 36* 35* 30* 24  CREATININE 1.57* 1.41 1.29 1.11  GLU 118* 107* 87 93  CALCIUM 8.5 8.1* 8.0* 8.1*  BILITOT -- 0.3 0.9 0.4  AST -- 13 13 11   ALT -- 6 7 6   ALKPHOS -- 43 41 40  PROT -- 5.2* 4.7* 4.6*  ALBUMIN -- 3.0* 2.8* 2.8*   CT ABDOMEN WO CONTRAST (ROUTINE)  Final Result   1. Limited evaluation of the current exam given lack of IV contrast administration.  2. Question mild peripancreatic fat stranding. Correlation with  serum lipase levels is suggested.  3. CBD is dilated up to 1.7 cm with smooth tapering, correlate with LFTs.  4. No evidence of intramural/intraluminal hematoma, limited by lack of IV contrast.  5. Bibasilar lung scarring, increased since 2009 exam.       Assessment and Plan: 84 y.o. female with  Visual field loss and visual acuity loss: Concerning for stroke following anesthesia with EGD.  Plan for stat head CT scan and recheck in the near future.  Fatigue and recent anemia with GI bleeding.  Recheck CBC and metabolic panel.  Goals of care: Lengthy discussion  with family and patient regarding palliative approach.  She would like to avoid hospitalizations if at all possible.  If patient needs repeat transfusion we will try to pursue outpatient if possible.  Additionally if worsening will consider hospice referral.  Discussed with PCP.  Patient has a follow-up appointment scheduled with PCP on Friday.   Orders Placed This Encounter  Procedures  . CT Head Wo Contrast    Standing Status:   Future    Number of Occurrences:   1    Standing Expiration Date:   03/23/2019    Order Specific Question:   Preferred imaging location?    Answer:   [facility]    Order Specific Question:   Radiology Contrast Protocol - do NOT remove file path    Answer:   [email or web address]  . CBC  . COMPLETE METABOLIC PANEL WITH GFR  . Magnesium   No orders of the defined types were placed in this encounter.    Historical information moved to improve visibility of documentation.  Past Medical History:  Diagnosis Date  . Aortic valve regurgitation 06/05/2014  . Atrial fibrillation (HCC) 06/05/2014  . Congestive heart failure (HCC) 06/05/2014  . LBBB (left bundle branch block) 06/05/2014   Past Surgical History:  Procedure Laterality Date  . ABDOMINAL HYSTERECTOMY    . BIV ICD GENERTAOR CHANGE OUT    . cardoversion     x 2 2015  . GALLBLADDER SURGERY     20 years ago  . placement of defibrillator     2012   Social History   Tobacco Use  . Smoking status: Never Smoker  . Smokeless tobacco: Never Used  Substance Use Topics  . Alcohol use: Yes    Alcohol/week: 0.0 standard drinks   family history is not on file.  Medications: Current Outpatient Medications  Medication Sig Dispense Refill  . carvedilol (COREG) 6.25 MG tablet Take 1 tablet (6.25 mg total) by mouth 2 (two) times daily with a meal. 180 tablet 3  . cholecalciferol (VITAMIN D) 1000 UNITS tablet One by mouth daily    . estrogens, conjugated, (PREMARIN) 0.625 MG  tablet One by mouth daily 90 tablet 3  . losartan (COZAAR) 25 MG tablet Take 25 mg by mouth daily.    . Rivaroxaban (XARELTO) 15 MG TABS tablet Take 1 tablet (15 mg total) by mouth daily with supper. 30 tablet   . furosemide (LASIX) 20 MG tablet Take 20 mg by mouth daily.     Marland Kitchen rOPINIRole (REQUIP) 0.25 MG tablet Take 1 tablet (0.25 mg total) by mouth at bedtime for 7 days, THEN 2 tablets (0.5 mg total) at bedtime for 23 days. Take 1-3 hrs before bed. 53 tablet 0   No current facility-administered medications for this visit.    Allergies  Allergen Reactions  . Amiodarone Other (See Comments)    Blurred Vision  . Atorvastatin Other (  See Comments)    cardiovascular arrest  . Lisinopril     hypotension  . Rosuvastatin Other (See Comments)    cardiovascular arrest  . Statins   . Penicillins Rash and Swelling     Discussed warning signs or symptoms. Please see discharge instructions. Patient expresses understanding.

## 2017-12-21 ENCOUNTER — Encounter: Payer: Self-pay | Admitting: Family Medicine

## 2017-12-21 DIAGNOSIS — R29706 NIHSS score 6: Secondary | ICD-10-CM | POA: Diagnosis not present

## 2017-12-21 DIAGNOSIS — K929 Disease of digestive system, unspecified: Secondary | ICD-10-CM | POA: Diagnosis not present

## 2017-12-21 DIAGNOSIS — I959 Hypotension, unspecified: Secondary | ICD-10-CM | POA: Diagnosis not present

## 2017-12-21 DIAGNOSIS — Z8719 Personal history of other diseases of the digestive system: Secondary | ICD-10-CM | POA: Diagnosis not present

## 2017-12-21 DIAGNOSIS — R1310 Dysphagia, unspecified: Secondary | ICD-10-CM | POA: Diagnosis not present

## 2017-12-21 DIAGNOSIS — I428 Other cardiomyopathies: Secondary | ICD-10-CM | POA: Diagnosis not present

## 2017-12-21 DIAGNOSIS — I5022 Chronic systolic (congestive) heart failure: Secondary | ICD-10-CM | POA: Diagnosis not present

## 2017-12-21 DIAGNOSIS — R945 Abnormal results of liver function studies: Secondary | ICD-10-CM | POA: Diagnosis not present

## 2017-12-21 DIAGNOSIS — R197 Diarrhea, unspecified: Secondary | ICD-10-CM | POA: Diagnosis not present

## 2017-12-21 DIAGNOSIS — R55 Syncope and collapse: Secondary | ICD-10-CM | POA: Diagnosis not present

## 2017-12-21 DIAGNOSIS — I63531 Cerebral infarction due to unspecified occlusion or stenosis of right posterior cerebral artery: Secondary | ICD-10-CM | POA: Diagnosis not present

## 2017-12-21 DIAGNOSIS — I82B12 Acute embolism and thrombosis of left subclavian vein: Secondary | ICD-10-CM | POA: Diagnosis not present

## 2017-12-21 DIAGNOSIS — I639 Cerebral infarction, unspecified: Secondary | ICD-10-CM | POA: Insufficient documentation

## 2017-12-21 DIAGNOSIS — R531 Weakness: Secondary | ICD-10-CM | POA: Diagnosis not present

## 2017-12-21 DIAGNOSIS — Z8673 Personal history of transient ischemic attack (TIA), and cerebral infarction without residual deficits: Secondary | ICD-10-CM | POA: Diagnosis not present

## 2017-12-21 DIAGNOSIS — I82622 Acute embolism and thrombosis of deep veins of left upper extremity: Secondary | ICD-10-CM | POA: Diagnosis not present

## 2017-12-21 LAB — COMPLETE METABOLIC PANEL WITH GFR
AG Ratio: 1.4 (calc) (ref 1.0–2.5)
ALBUMIN MSPROF: 3.4 g/dL — AB (ref 3.6–5.1)
ALKALINE PHOSPHATASE (APISO): 56 U/L (ref 33–130)
ALT: 55 U/L — AB (ref 6–29)
AST: 63 U/L — ABNORMAL HIGH (ref 10–35)
BILIRUBIN TOTAL: 0.4 mg/dL (ref 0.2–1.2)
BUN / CREAT RATIO: 12 (calc) (ref 6–22)
BUN: 19 mg/dL (ref 7–25)
CO2: 21 mmol/L (ref 20–32)
Calcium: 8.5 mg/dL — ABNORMAL LOW (ref 8.6–10.4)
Chloride: 104 mmol/L (ref 98–110)
Creat: 1.62 mg/dL — ABNORMAL HIGH (ref 0.60–0.88)
GFR, Est African American: 34 mL/min/{1.73_m2} — ABNORMAL LOW (ref >=60)
GFR, Est Non African American: 29 mL/min/{1.73_m2} — ABNORMAL LOW (ref >=60)
GLUCOSE: 116 mg/dL — AB (ref 65–99)
Globulin: 2.4 g/dL (calc) (ref 1.9–3.7)
Potassium: 3.9 mmol/L (ref 3.5–5.3)
Sodium: 137 mmol/L (ref 135–146)
Total Protein: 5.8 g/dL — ABNORMAL LOW (ref 6.1–8.1)

## 2017-12-21 LAB — CBC
HCT: 23.8 % — ABNORMAL LOW (ref 35.0–45.0)
Hemoglobin: 7.8 g/dL — ABNORMAL LOW (ref 11.7–15.5)
MCH: 29.9 pg (ref 27.0–33.0)
MCHC: 32.8 g/dL (ref 32.0–36.0)
MCV: 91.2 fL (ref 80.0–100.0)
MPV: 8.7 fL (ref 7.5–12.5)
PLATELETS: 283 10*3/uL (ref 140–400)
RBC: 2.61 10*6/uL — ABNORMAL LOW (ref 3.80–5.10)
RDW: 15.1 % — ABNORMAL HIGH (ref 11.0–15.0)
WBC: 7.3 10*3/uL (ref 3.8–10.8)

## 2017-12-22 DIAGNOSIS — I5022 Chronic systolic (congestive) heart failure: Secondary | ICD-10-CM | POA: Diagnosis present

## 2017-12-22 DIAGNOSIS — I63531 Cerebral infarction due to unspecified occlusion or stenosis of right posterior cerebral artery: Secondary | ICD-10-CM | POA: Diagnosis present

## 2017-12-22 DIAGNOSIS — I083 Combined rheumatic disorders of mitral, aortic and tricuspid valves: Secondary | ICD-10-CM | POA: Diagnosis not present

## 2017-12-22 DIAGNOSIS — D649 Anemia, unspecified: Secondary | ICD-10-CM | POA: Diagnosis present

## 2017-12-22 DIAGNOSIS — Z8719 Personal history of other diseases of the digestive system: Secondary | ICD-10-CM | POA: Diagnosis not present

## 2017-12-22 DIAGNOSIS — Z8673 Personal history of transient ischemic attack (TIA), and cerebral infarction without residual deficits: Secondary | ICD-10-CM | POA: Diagnosis not present

## 2017-12-22 DIAGNOSIS — N183 Chronic kidney disease, stage 3 (moderate): Secondary | ICD-10-CM | POA: Diagnosis not present

## 2017-12-22 DIAGNOSIS — R7989 Other specified abnormal findings of blood chemistry: Secondary | ICD-10-CM | POA: Diagnosis not present

## 2017-12-22 DIAGNOSIS — I442 Atrioventricular block, complete: Secondary | ICD-10-CM | POA: Diagnosis not present

## 2017-12-22 DIAGNOSIS — I82A12 Acute embolism and thrombosis of left axillary vein: Secondary | ICD-10-CM | POA: Diagnosis not present

## 2017-12-22 DIAGNOSIS — I82622 Acute embolism and thrombosis of deep veins of left upper extremity: Secondary | ICD-10-CM | POA: Diagnosis not present

## 2017-12-22 DIAGNOSIS — I428 Other cardiomyopathies: Secondary | ICD-10-CM | POA: Diagnosis present

## 2017-12-22 DIAGNOSIS — H547 Unspecified visual loss: Secondary | ICD-10-CM | POA: Diagnosis not present

## 2017-12-22 DIAGNOSIS — I82B11 Acute embolism and thrombosis of right subclavian vein: Secondary | ICD-10-CM | POA: Diagnosis not present

## 2017-12-22 DIAGNOSIS — I6523 Occlusion and stenosis of bilateral carotid arteries: Secondary | ICD-10-CM | POA: Diagnosis not present

## 2017-12-22 DIAGNOSIS — I10 Essential (primary) hypertension: Secondary | ICD-10-CM | POA: Diagnosis not present

## 2017-12-22 DIAGNOSIS — R945 Abnormal results of liver function studies: Secondary | ICD-10-CM | POA: Diagnosis not present

## 2017-12-22 DIAGNOSIS — G936 Cerebral edema: Secondary | ICD-10-CM | POA: Diagnosis not present

## 2017-12-22 DIAGNOSIS — Z7409 Other reduced mobility: Secondary | ICD-10-CM | POA: Diagnosis not present

## 2017-12-22 DIAGNOSIS — I2510 Atherosclerotic heart disease of native coronary artery without angina pectoris: Secondary | ICD-10-CM | POA: Diagnosis present

## 2017-12-22 DIAGNOSIS — K5790 Diverticulosis of intestine, part unspecified, without perforation or abscess without bleeding: Secondary | ICD-10-CM | POA: Diagnosis not present

## 2017-12-22 DIAGNOSIS — I110 Hypertensive heart disease with heart failure: Secondary | ICD-10-CM | POA: Diagnosis present

## 2017-12-22 DIAGNOSIS — I82B12 Acute embolism and thrombosis of left subclavian vein: Secondary | ICD-10-CM | POA: Diagnosis not present

## 2017-12-22 DIAGNOSIS — E785 Hyperlipidemia, unspecified: Secondary | ICD-10-CM | POA: Diagnosis present

## 2017-12-22 DIAGNOSIS — Z95810 Presence of automatic (implantable) cardiac defibrillator: Secondary | ICD-10-CM | POA: Diagnosis not present

## 2017-12-22 DIAGNOSIS — I639 Cerebral infarction, unspecified: Secondary | ICD-10-CM | POA: Diagnosis not present

## 2017-12-22 DIAGNOSIS — Z7901 Long term (current) use of anticoagulants: Secondary | ICD-10-CM | POA: Diagnosis not present

## 2017-12-22 DIAGNOSIS — I129 Hypertensive chronic kidney disease with stage 1 through stage 4 chronic kidney disease, or unspecified chronic kidney disease: Secondary | ICD-10-CM | POA: Diagnosis not present

## 2017-12-22 DIAGNOSIS — R29706 NIHSS score 6: Secondary | ICD-10-CM | POA: Diagnosis present

## 2017-12-22 DIAGNOSIS — I6389 Other cerebral infarction: Secondary | ICD-10-CM | POA: Diagnosis not present

## 2017-12-22 DIAGNOSIS — I4891 Unspecified atrial fibrillation: Secondary | ICD-10-CM | POA: Diagnosis present

## 2017-12-22 DIAGNOSIS — H53462 Homonymous bilateral field defects, left side: Secondary | ICD-10-CM | POA: Diagnosis present

## 2017-12-22 DIAGNOSIS — Z90710 Acquired absence of both cervix and uterus: Secondary | ICD-10-CM | POA: Diagnosis not present

## 2017-12-22 DIAGNOSIS — E876 Hypokalemia: Secondary | ICD-10-CM | POA: Diagnosis not present

## 2017-12-22 DIAGNOSIS — R9431 Abnormal electrocardiogram [ECG] [EKG]: Secondary | ICD-10-CM | POA: Diagnosis not present

## 2017-12-22 DIAGNOSIS — I951 Orthostatic hypotension: Secondary | ICD-10-CM | POA: Diagnosis not present

## 2017-12-22 DIAGNOSIS — K922 Gastrointestinal hemorrhage, unspecified: Secondary | ICD-10-CM | POA: Diagnosis not present

## 2017-12-22 DIAGNOSIS — I82612 Acute embolism and thrombosis of superficial veins of left upper extremity: Secondary | ICD-10-CM | POA: Diagnosis not present

## 2017-12-22 DIAGNOSIS — Z79899 Other long term (current) drug therapy: Secondary | ICD-10-CM | POA: Diagnosis not present

## 2017-12-22 DIAGNOSIS — D500 Iron deficiency anemia secondary to blood loss (chronic): Secondary | ICD-10-CM | POA: Diagnosis not present

## 2017-12-22 DIAGNOSIS — R109 Unspecified abdominal pain: Secondary | ICD-10-CM | POA: Diagnosis not present

## 2017-12-22 DIAGNOSIS — R932 Abnormal findings on diagnostic imaging of liver and biliary tract: Secondary | ICD-10-CM | POA: Diagnosis not present

## 2017-12-22 DIAGNOSIS — D62 Acute posthemorrhagic anemia: Secondary | ICD-10-CM | POA: Diagnosis not present

## 2017-12-22 DIAGNOSIS — I509 Heart failure, unspecified: Secondary | ICD-10-CM | POA: Diagnosis not present

## 2017-12-23 ENCOUNTER — Encounter: Payer: Self-pay | Admitting: Osteopathic Medicine

## 2017-12-23 ENCOUNTER — Ambulatory Visit: Payer: Medicare Other | Admitting: Osteopathic Medicine

## 2017-12-27 DIAGNOSIS — R4189 Other symptoms and signs involving cognitive functions and awareness: Secondary | ICD-10-CM | POA: Diagnosis present

## 2017-12-27 DIAGNOSIS — R740 Nonspecific elevation of levels of transaminase and lactic acid dehydrogenase [LDH]: Secondary | ICD-10-CM | POA: Diagnosis not present

## 2017-12-27 DIAGNOSIS — I69398 Other sequelae of cerebral infarction: Secondary | ICD-10-CM | POA: Diagnosis not present

## 2017-12-27 DIAGNOSIS — I447 Left bundle-branch block, unspecified: Secondary | ICD-10-CM | POA: Diagnosis not present

## 2017-12-27 DIAGNOSIS — R7989 Other specified abnormal findings of blood chemistry: Secondary | ICD-10-CM | POA: Diagnosis not present

## 2017-12-27 DIAGNOSIS — I82B12 Acute embolism and thrombosis of left subclavian vein: Secondary | ICD-10-CM | POA: Diagnosis present

## 2017-12-27 DIAGNOSIS — N179 Acute kidney failure, unspecified: Secondary | ICD-10-CM | POA: Diagnosis not present

## 2017-12-27 DIAGNOSIS — I130 Hypertensive heart and chronic kidney disease with heart failure and stage 1 through stage 4 chronic kidney disease, or unspecified chronic kidney disease: Secondary | ICD-10-CM | POA: Diagnosis present

## 2017-12-27 DIAGNOSIS — Z5189 Encounter for other specified aftercare: Secondary | ICD-10-CM | POA: Diagnosis not present

## 2017-12-27 DIAGNOSIS — I340 Nonrheumatic mitral (valve) insufficiency: Secondary | ICD-10-CM | POA: Diagnosis not present

## 2017-12-27 DIAGNOSIS — I493 Ventricular premature depolarization: Secondary | ICD-10-CM | POA: Diagnosis not present

## 2017-12-27 DIAGNOSIS — N183 Chronic kidney disease, stage 3 (moderate): Secondary | ICD-10-CM | POA: Diagnosis present

## 2017-12-27 DIAGNOSIS — I4892 Unspecified atrial flutter: Secondary | ICD-10-CM | POA: Diagnosis present

## 2017-12-27 DIAGNOSIS — E785 Hyperlipidemia, unspecified: Secondary | ICD-10-CM | POA: Diagnosis present

## 2017-12-27 DIAGNOSIS — R945 Abnormal results of liver function studies: Secondary | ICD-10-CM | POA: Diagnosis present

## 2017-12-27 DIAGNOSIS — E876 Hypokalemia: Secondary | ICD-10-CM | POA: Diagnosis present

## 2017-12-27 DIAGNOSIS — I5042 Chronic combined systolic (congestive) and diastolic (congestive) heart failure: Secondary | ICD-10-CM | POA: Diagnosis present

## 2017-12-27 DIAGNOSIS — D62 Acute posthemorrhagic anemia: Secondary | ICD-10-CM | POA: Diagnosis present

## 2017-12-27 DIAGNOSIS — I951 Orthostatic hypotension: Secondary | ICD-10-CM | POA: Diagnosis present

## 2017-12-27 DIAGNOSIS — I420 Dilated cardiomyopathy: Secondary | ICD-10-CM | POA: Diagnosis present

## 2017-12-27 DIAGNOSIS — Z8673 Personal history of transient ischemic attack (TIA), and cerebral infarction without residual deficits: Secondary | ICD-10-CM | POA: Diagnosis not present

## 2017-12-27 DIAGNOSIS — Z95810 Presence of automatic (implantable) cardiac defibrillator: Secondary | ICD-10-CM | POA: Diagnosis not present

## 2017-12-27 DIAGNOSIS — I4891 Unspecified atrial fibrillation: Secondary | ICD-10-CM | POA: Diagnosis present

## 2017-12-27 DIAGNOSIS — I429 Cardiomyopathy, unspecified: Secondary | ICD-10-CM | POA: Diagnosis not present

## 2017-12-27 DIAGNOSIS — I509 Heart failure, unspecified: Secondary | ICD-10-CM | POA: Diagnosis not present

## 2017-12-27 DIAGNOSIS — I82B22 Chronic embolism and thrombosis of left subclavian vein: Secondary | ICD-10-CM | POA: Diagnosis not present

## 2017-12-27 DIAGNOSIS — I110 Hypertensive heart disease with heart failure: Secondary | ICD-10-CM | POA: Diagnosis not present

## 2017-12-27 DIAGNOSIS — I82622 Acute embolism and thrombosis of deep veins of left upper extremity: Secondary | ICD-10-CM | POA: Diagnosis present

## 2017-12-27 DIAGNOSIS — I5020 Unspecified systolic (congestive) heart failure: Secondary | ICD-10-CM | POA: Diagnosis not present

## 2017-12-27 DIAGNOSIS — R55 Syncope and collapse: Secondary | ICD-10-CM | POA: Diagnosis not present

## 2017-12-27 DIAGNOSIS — Z7409 Other reduced mobility: Secondary | ICD-10-CM | POA: Diagnosis not present

## 2017-12-27 DIAGNOSIS — Z950 Presence of cardiac pacemaker: Secondary | ICD-10-CM | POA: Diagnosis not present

## 2017-12-27 DIAGNOSIS — I2510 Atherosclerotic heart disease of native coronary artery without angina pectoris: Secondary | ICD-10-CM | POA: Diagnosis present

## 2017-12-27 DIAGNOSIS — I639 Cerebral infarction, unspecified: Secondary | ICD-10-CM | POA: Diagnosis not present

## 2017-12-27 DIAGNOSIS — H5340 Unspecified visual field defects: Secondary | ICD-10-CM | POA: Diagnosis present

## 2017-12-27 DIAGNOSIS — I82602 Acute embolism and thrombosis of unspecified veins of left upper extremity: Secondary | ICD-10-CM | POA: Diagnosis not present

## 2017-12-27 DIAGNOSIS — I482 Chronic atrial fibrillation, unspecified: Secondary | ICD-10-CM | POA: Diagnosis not present

## 2017-12-27 DIAGNOSIS — I080 Rheumatic disorders of both mitral and aortic valves: Secondary | ICD-10-CM | POA: Diagnosis not present

## 2018-01-03 ENCOUNTER — Inpatient Hospital Stay: Payer: Medicare Other | Admitting: Osteopathic Medicine

## 2018-01-03 DIAGNOSIS — I5022 Chronic systolic (congestive) heart failure: Secondary | ICD-10-CM | POA: Diagnosis not present

## 2018-01-03 DIAGNOSIS — M6281 Muscle weakness (generalized): Secondary | ICD-10-CM | POA: Diagnosis not present

## 2018-01-03 DIAGNOSIS — I69354 Hemiplegia and hemiparesis following cerebral infarction affecting left non-dominant side: Secondary | ICD-10-CM | POA: Diagnosis not present

## 2018-01-03 DIAGNOSIS — I130 Hypertensive heart and chronic kidney disease with heart failure and stage 1 through stage 4 chronic kidney disease, or unspecified chronic kidney disease: Secondary | ICD-10-CM | POA: Diagnosis not present

## 2018-01-03 DIAGNOSIS — H538 Other visual disturbances: Secondary | ICD-10-CM | POA: Diagnosis not present

## 2018-01-03 DIAGNOSIS — E785 Hyperlipidemia, unspecified: Secondary | ICD-10-CM | POA: Diagnosis not present

## 2018-01-03 DIAGNOSIS — I428 Other cardiomyopathies: Secondary | ICD-10-CM | POA: Diagnosis not present

## 2018-01-03 DIAGNOSIS — I447 Left bundle-branch block, unspecified: Secondary | ICD-10-CM | POA: Diagnosis not present

## 2018-01-03 DIAGNOSIS — D62 Acute posthemorrhagic anemia: Secondary | ICD-10-CM | POA: Diagnosis not present

## 2018-01-03 DIAGNOSIS — I69398 Other sequelae of cerebral infarction: Secondary | ICD-10-CM | POA: Diagnosis not present

## 2018-01-03 DIAGNOSIS — I2510 Atherosclerotic heart disease of native coronary artery without angina pectoris: Secondary | ICD-10-CM | POA: Diagnosis not present

## 2018-01-03 DIAGNOSIS — I951 Orthostatic hypotension: Secondary | ICD-10-CM | POA: Diagnosis not present

## 2018-01-03 DIAGNOSIS — I080 Rheumatic disorders of both mitral and aortic valves: Secondary | ICD-10-CM | POA: Diagnosis not present

## 2018-01-03 DIAGNOSIS — Z95810 Presence of automatic (implantable) cardiac defibrillator: Secondary | ICD-10-CM | POA: Diagnosis not present

## 2018-01-03 DIAGNOSIS — I82622 Acute embolism and thrombosis of deep veins of left upper extremity: Secondary | ICD-10-CM | POA: Diagnosis not present

## 2018-01-03 DIAGNOSIS — N183 Chronic kidney disease, stage 3 (moderate): Secondary | ICD-10-CM | POA: Diagnosis not present

## 2018-01-03 DIAGNOSIS — I4821 Permanent atrial fibrillation: Secondary | ICD-10-CM | POA: Diagnosis not present

## 2018-01-04 ENCOUNTER — Telehealth: Payer: Self-pay

## 2018-01-04 ENCOUNTER — Other Ambulatory Visit: Payer: Self-pay | Admitting: Osteopathic Medicine

## 2018-01-04 DIAGNOSIS — I428 Other cardiomyopathies: Secondary | ICD-10-CM | POA: Diagnosis not present

## 2018-01-04 DIAGNOSIS — I130 Hypertensive heart and chronic kidney disease with heart failure and stage 1 through stage 4 chronic kidney disease, or unspecified chronic kidney disease: Secondary | ICD-10-CM | POA: Diagnosis not present

## 2018-01-04 DIAGNOSIS — I69354 Hemiplegia and hemiparesis following cerebral infarction affecting left non-dominant side: Secondary | ICD-10-CM | POA: Diagnosis not present

## 2018-01-04 DIAGNOSIS — G2581 Restless legs syndrome: Secondary | ICD-10-CM

## 2018-01-04 DIAGNOSIS — N183 Chronic kidney disease, stage 3 (moderate): Secondary | ICD-10-CM | POA: Diagnosis not present

## 2018-01-04 DIAGNOSIS — I2510 Atherosclerotic heart disease of native coronary artery without angina pectoris: Secondary | ICD-10-CM | POA: Diagnosis not present

## 2018-01-04 DIAGNOSIS — I5022 Chronic systolic (congestive) heart failure: Secondary | ICD-10-CM | POA: Diagnosis not present

## 2018-01-04 NOTE — Telephone Encounter (Signed)
[facility] requesting [facility] refills for requip. As per provider's last OV notes - "trial Requip given fewer renal concerns". Pls advise if refill is appropriate. Thanks.

## 2018-01-04 NOTE — Telephone Encounter (Signed)
I've spoke with pt's son & pt. Both we made aware of med refill. As per pt, she still has plenty of medication available on hand. She is going to hold off from getting med refill from pharmacy.

## 2018-01-04 NOTE — Telephone Encounter (Signed)
[name] with [facility] called and left a message stating patient is going to have her labs drawn later this week.

## 2018-01-05 ENCOUNTER — Telehealth: Payer: Self-pay

## 2018-01-05 ENCOUNTER — Inpatient Hospital Stay: Payer: Medicare Other | Admitting: Osteopathic Medicine

## 2018-01-05 DIAGNOSIS — I69354 Hemiplegia and hemiparesis following cerebral infarction affecting left non-dominant side: Secondary | ICD-10-CM | POA: Diagnosis not present

## 2018-01-05 DIAGNOSIS — I2510 Atherosclerotic heart disease of native coronary artery without angina pectoris: Secondary | ICD-10-CM | POA: Diagnosis not present

## 2018-01-05 DIAGNOSIS — I428 Other cardiomyopathies: Secondary | ICD-10-CM | POA: Diagnosis not present

## 2018-01-05 DIAGNOSIS — I5022 Chronic systolic (congestive) heart failure: Secondary | ICD-10-CM | POA: Diagnosis not present

## 2018-01-05 DIAGNOSIS — I130 Hypertensive heart and chronic kidney disease with heart failure and stage 1 through stage 4 chronic kidney disease, or unspecified chronic kidney disease: Secondary | ICD-10-CM | POA: Diagnosis not present

## 2018-01-05 DIAGNOSIS — N183 Chronic kidney disease, stage 3 (moderate): Secondary | ICD-10-CM | POA: Diagnosis not present

## 2018-01-05 NOTE — Telephone Encounter (Signed)
[place] from [facility] called stating that lab results will be faxed to provider for review. If provider has any inquiries, please call [phone].

## 2018-01-06 NOTE — Telephone Encounter (Signed)
Labs reviewed.  Hemoglobin is still below normal but looking better.  Kidney function also still low but looking a bit better.

## 2018-01-09 ENCOUNTER — Telehealth: Payer: Self-pay

## 2018-01-09 DIAGNOSIS — E860 Dehydration: Secondary | ICD-10-CM | POA: Diagnosis not present

## 2018-01-09 DIAGNOSIS — R0989 Other specified symptoms and signs involving the circulatory and respiratory systems: Secondary | ICD-10-CM | POA: Diagnosis not present

## 2018-01-09 DIAGNOSIS — R1110 Vomiting, unspecified: Secondary | ICD-10-CM | POA: Diagnosis not present

## 2018-01-09 DIAGNOSIS — R197 Diarrhea, unspecified: Secondary | ICD-10-CM | POA: Diagnosis not present

## 2018-01-09 DIAGNOSIS — R55 Syncope and collapse: Secondary | ICD-10-CM | POA: Diagnosis not present

## 2018-01-09 DIAGNOSIS — I959 Hypotension, unspecified: Secondary | ICD-10-CM | POA: Diagnosis not present

## 2018-01-09 NOTE — Telephone Encounter (Signed)
Maya OT from [facility] called requesting a verbal order to continue occupational therapy for pt. It will be for visual strategy and adapt pt's home area so that mobility around living quarters is easier. Therapy will be:  1x:once a wk 1 x: twice a wk 1x: for three wks  Verbal order was given.

## 2018-01-10 ENCOUNTER — Ambulatory Visit: Payer: Medicare Other | Admitting: Osteopathic Medicine

## 2018-01-10 DIAGNOSIS — N183 Chronic kidney disease, stage 3 (moderate): Secondary | ICD-10-CM | POA: Diagnosis not present

## 2018-01-10 DIAGNOSIS — I5022 Chronic systolic (congestive) heart failure: Secondary | ICD-10-CM | POA: Diagnosis not present

## 2018-01-10 DIAGNOSIS — I130 Hypertensive heart and chronic kidney disease with heart failure and stage 1 through stage 4 chronic kidney disease, or unspecified chronic kidney disease: Secondary | ICD-10-CM | POA: Diagnosis not present

## 2018-01-10 DIAGNOSIS — I69354 Hemiplegia and hemiparesis following cerebral infarction affecting left non-dominant side: Secondary | ICD-10-CM | POA: Diagnosis not present

## 2018-01-10 DIAGNOSIS — I2510 Atherosclerotic heart disease of native coronary artery without angina pectoris: Secondary | ICD-10-CM | POA: Diagnosis not present

## 2018-01-10 DIAGNOSIS — I428 Other cardiomyopathies: Secondary | ICD-10-CM | POA: Diagnosis not present

## 2018-01-11 DIAGNOSIS — N183 Chronic kidney disease, stage 3 (moderate): Secondary | ICD-10-CM | POA: Diagnosis not present

## 2018-01-11 DIAGNOSIS — I69354 Hemiplegia and hemiparesis following cerebral infarction affecting left non-dominant side: Secondary | ICD-10-CM | POA: Diagnosis not present

## 2018-01-11 DIAGNOSIS — I428 Other cardiomyopathies: Secondary | ICD-10-CM | POA: Diagnosis not present

## 2018-01-11 DIAGNOSIS — I5022 Chronic systolic (congestive) heart failure: Secondary | ICD-10-CM | POA: Diagnosis not present

## 2018-01-11 DIAGNOSIS — I2510 Atherosclerotic heart disease of native coronary artery without angina pectoris: Secondary | ICD-10-CM | POA: Diagnosis not present

## 2018-01-11 DIAGNOSIS — I130 Hypertensive heart and chronic kidney disease with heart failure and stage 1 through stage 4 chronic kidney disease, or unspecified chronic kidney disease: Secondary | ICD-10-CM | POA: Diagnosis not present

## 2018-01-12 ENCOUNTER — Telehealth: Payer: Self-pay

## 2018-01-12 DIAGNOSIS — I130 Hypertensive heart and chronic kidney disease with heart failure and stage 1 through stage 4 chronic kidney disease, or unspecified chronic kidney disease: Secondary | ICD-10-CM | POA: Diagnosis not present

## 2018-01-12 DIAGNOSIS — I2510 Atherosclerotic heart disease of native coronary artery without angina pectoris: Secondary | ICD-10-CM | POA: Diagnosis not present

## 2018-01-12 DIAGNOSIS — N183 Chronic kidney disease, stage 3 (moderate): Secondary | ICD-10-CM | POA: Diagnosis not present

## 2018-01-12 DIAGNOSIS — I428 Other cardiomyopathies: Secondary | ICD-10-CM | POA: Diagnosis not present

## 2018-01-12 DIAGNOSIS — I5022 Chronic systolic (congestive) heart failure: Secondary | ICD-10-CM | POA: Diagnosis not present

## 2018-01-12 DIAGNOSIS — I69354 Hemiplegia and hemiparesis following cerebral infarction affecting left non-dominant side: Secondary | ICD-10-CM | POA: Diagnosis not present

## 2018-01-12 NOTE — Telephone Encounter (Signed)
Lindsey Reynolds is still having diarrhea multiple times daily. She is taking Imodium 3 times daily. She wanted to know if there is anything else that would help with the diarrhea. Please advise.

## 2018-01-13 DIAGNOSIS — N183 Chronic kidney disease, stage 3 (moderate): Secondary | ICD-10-CM | POA: Diagnosis not present

## 2018-01-13 DIAGNOSIS — I69354 Hemiplegia and hemiparesis following cerebral infarction affecting left non-dominant side: Secondary | ICD-10-CM | POA: Diagnosis not present

## 2018-01-13 DIAGNOSIS — I2510 Atherosclerotic heart disease of native coronary artery without angina pectoris: Secondary | ICD-10-CM | POA: Diagnosis not present

## 2018-01-13 DIAGNOSIS — I130 Hypertensive heart and chronic kidney disease with heart failure and stage 1 through stage 4 chronic kidney disease, or unspecified chronic kidney disease: Secondary | ICD-10-CM | POA: Diagnosis not present

## 2018-01-13 DIAGNOSIS — I5022 Chronic systolic (congestive) heart failure: Secondary | ICD-10-CM | POA: Diagnosis not present

## 2018-01-13 DIAGNOSIS — I428 Other cardiomyopathies: Secondary | ICD-10-CM | POA: Diagnosis not present

## 2018-01-13 MED ORDER — DIPHENOXYLATE-ATROPINE 2.5-0.025 MG PO TABS: 1.0000 | ORAL_TABLET | Freq: Four times a day (QID) | ORAL | 0 refills | Status: DC | PRN

## 2018-01-13 NOTE — Telephone Encounter (Signed)
Patient advised.

## 2018-01-13 NOTE — Telephone Encounter (Signed)
Lomotil sent to [facility] If not helping, or if blood in stool, needs seen or to ER

## 2018-01-18 ENCOUNTER — Ambulatory Visit (INDEPENDENT_AMBULATORY_CARE_PROVIDER_SITE_OTHER): Payer: Medicare Other | Admitting: Osteopathic Medicine

## 2018-01-18 ENCOUNTER — Encounter: Payer: Self-pay | Admitting: Osteopathic Medicine

## 2018-01-18 VITALS — BP 118/72 | HR 68

## 2018-01-18 DIAGNOSIS — I509 Heart failure, unspecified: Secondary | ICD-10-CM

## 2018-01-18 DIAGNOSIS — L299 Pruritus, unspecified: Secondary | ICD-10-CM

## 2018-01-18 DIAGNOSIS — N184 Chronic kidney disease, stage 4 (severe): Secondary | ICD-10-CM

## 2018-01-18 DIAGNOSIS — R197 Diarrhea, unspecified: Secondary | ICD-10-CM

## 2018-01-18 DIAGNOSIS — I639 Cerebral infarction, unspecified: Secondary | ICD-10-CM

## 2018-01-18 DIAGNOSIS — I4811 Longstanding persistent atrial fibrillation: Secondary | ICD-10-CM | POA: Diagnosis not present

## 2018-01-18 DIAGNOSIS — Z95810 Presence of automatic (implantable) cardiac defibrillator: Secondary | ICD-10-CM | POA: Diagnosis not present

## 2018-01-18 NOTE — Progress Notes (Signed)
HPI: Lindsey Reynolds is a 84 y.o. female who  has a past medical history of Aortic valve regurgitation (06/05/2014), Atrial fibrillation (HCC) (06/05/2014), Congestive heart failure (HCC) (06/05/2014), and LBBB (left bundle branch block) (06/05/2014).  she presents to [facility] today, 01/18/18,  for chief complaint of: Hospital follow-up Stroke GI  CHF   ER visit 01/09/2018, sent by cardiology office for hypotension.  Received a bag of IV fluids, labs reassuring.  She did not want to be admitted.  ER visit 01/06/2018 also hypotensive but did not want to be admitted.  Problems with loose stool/diarrhea.  Lomotil was prescribed but she doesn't feel that it is working particularly well.  Syncope with sitting/standing for too long, becomes quite dizzy.  Patient reports blood pressure is normal when lying down.   Patient reports she is overall adapting okay to her complications due to stroke, significantly blindness and left-sided weakness.  She states that she has gained some distance vision back, is able to read very large print but for the most part is blind such that she cannot participate in activities of daily living that she previously performed independently.  She is of course frustrated with her situation, she worries that she is a burden on her family in particular her husband who is now having to take care of her.  She states she has not gotten much benefit from physical therapy and occupational therapy and home would rather not receive the services.  Cardiac condition also worsening.  Patient is following with cardiology.  She states they are possibly planning for some kind of upcoming surgery/procedure but she is not sure about this.  Recently, blood pressure medications have been stopped, she is very rarely taking Lasix.   Patient is accompanied by husband who assists with history-taking.     Past medical, surgical, social and family history  reviewed:  Patient Active Problem List   Diagnosis Date Noted  . Cerebrovascular accident (CVA) (HCC) 12/21/2017  . CKD (chronic kidney disease) stage 4, GFR 15-29 ml/min (HCC) 08/16/2017  . Vitamin D deficiency 12/31/2015  . S/P ablation of atrial fibrillation 08/19/2015  . Atrial tachycardia (HCC) 07/31/2015  . Atrial flutter (HCC) 07/15/2015  . Biventricular ICD (implantable cardioverter-defibrillator) in place 12/03/2014  . Essential hypertension 09/11/2014  . Mitral valve regurgitation 06/13/2014  . Atrial fibrillation (HCC) 06/05/2014  . Aortic valve regurgitation 06/05/2014  . LBBB (left bundle branch block) 06/05/2014  . Congestive heart failure (HCC) 06/05/2014  . Menopause 06/05/2014  . Dilated cardiomyopathy (HCC) 10/18/2011    Past Surgical History:  Procedure Laterality Date  . ABDOMINAL HYSTERECTOMY    . BIV ICD GENERTAOR CHANGE OUT    . cardoversion     x 2 2015  . GALLBLADDER SURGERY     20 years ago  . placement of defibrillator     2012    Social History   Tobacco Use  . Smoking status: Never Smoker  . Smokeless tobacco: Never Used  Substance Use Topics  . Alcohol use: Yes    Alcohol/week: 0.0 standard drinks    No family history on file.   Current medication list and allergy/intolerance information reviewed:    Current Outpatient Medications  Medication Sig Dispense Refill  . carvedilol (COREG) 6.25 MG tablet Take 1 tablet (6.25 mg total) by mouth 2 (two) times daily with a meal. 180 tablet 3  . cholecalciferol (VITAMIN D) 1000 UNITS tablet One by mouth daily    . diphenoxylate-atropine (  LOMOTIL) 2.5-0.025 MG tablet Take 1 tablet by mouth 4 (four) times daily as needed for diarrhea or loose stools. 30 tablet 0  . estrogens, conjugated, (PREMARIN) 0.625 MG tablet One by mouth daily 90 tablet 3  . furosemide (LASIX) 20 MG tablet Take 20 mg by mouth daily.     Marland Kitchen losartan (COZAAR) 25 MG tablet Take 25 mg by mouth daily.    . Rivaroxaban (XARELTO)  15 MG TABS tablet Take 1 tablet (15 mg total) by mouth daily with supper. 30 tablet   . rOPINIRole (REQUIP) 0.25 MG tablet SEE NOTES 53 tablet 0   No current facility-administered medications for this visit.     Allergies  Allergen Reactions  . Amiodarone Other (See Comments)    Blurred Vision  . Atorvastatin Other (See Comments)    cardiovascular arrest  . Lisinopril     hypotension  . Rosuvastatin Other (See Comments)    cardiovascular arrest  . Statins   . Penicillins Rash and Swelling      Review of Systems:  Constitutional:  No  fever, no chills, +recent illness, No unintentional weight changes. +significant fatigue.   HEENT: No  headache, +vision change, no hearing change, No sore throat, No  sinus pressure  Cardiac: No  chest pain, No  pressure, No palpitations, No  Orthopnea  Respiratory:  +shortness of breath. No  Cough  Gastrointestinal: No  abdominal pain, No  nausea, No  vomiting,  No  blood in stool, +diarrhea, No  constipation   Musculoskeletal: No new myalgia/arthralgia  Skin: No  Rash  Neurologic: +L arm weakness, No  dizziness, No  slurred speech/focal weakness/facial droop  Psychiatric: +concerns with depression, No  concerns with anxiety, +sleep problems, No mood problems  Exam:  BP 118/72 (BP Location: Left Arm, Patient Position: Sitting, Cuff Size: Normal)   Pulse 68   SpO2 98%   Constitutional: VS see above. General Appearance: alert, well-developed, well-nourished, NAD  Eyes: Normal lids and conjunctive, non-icteric sclera  Ears, Nose, Mouth, Throat: MMM, Normal external inspection ears/nares/mouth/lips/gums.  Neck: No masses, trachea midline.   Respiratory: Normal respiratory effort. no wheeze, no rhonchi, no rales  Cardiovascular: S1/S2 normal, no rub/gallop auscultated. RRR. No lower extremity edema. .  Gastrointestinal: Nontender, no masses.   Musculoskeletal: Gait normal   Psychiatric: Normal judgment/insight. Normal mood and  affect. Oriented x3.       ASSESSMENT/PLAN: The primary encounter diagnosis was Diarrhea, unspecified type. Diagnoses of Itching, Cerebrovascular accident (CVA), unspecified mechanism (HCC), Chronic congestive heart failure, unspecified heart failure type (HCC), Longstanding persistent atrial fibrillation, CKD (chronic kidney disease) stage 4, GFR 15-29 ml/min (HCC), and Biventricular ICD (implantable cardioverter-defibrillator) in place were also pertinent to this visit.   Patient's greatest concern is her diarrhea, uncertain cause.  We will go ahead and work-up for possible C. difficile given recent hospitalizations.  Other labs as below.  Despite significant sequela from stroke and worsening cardiac issues, patient is of sound mind and able to make informed decisions.  We had a long talk about her wishes in the event of a cardiopulmonary arrest.  Patient desires to be full code but would not want to be on life support long-term.  She states she has a living will in place and has designated her husband and her daughter as her surrogate decision makers.  Patient's goals are to remain in the home.  I do not think that she is in any immediate danger of decompensation, however this could certainly change.  If so, patient states she would be open to hospice at that point but right now she does not think that she is in need of services.  She is getting help from her husband and they are hiring help around the house.  Orders Placed This Encounter  Procedures  . Ova and parasite examination  . Stool Culture  . Urine Culture  . C. difficile GDH and Toxin A/B  . Stool, WBC/Lactoferrin  . CBC with Differential/Platelet  . COMPLETE METABOLIC PANEL WITH GFR  . Lipase  . Urinalysis, Routine w reflex microscopic      Patient Instructions  Plan:  Itching:  Try Benadryl 25 mg as needed every 4-6 hours  Can instead try Claritin 10 mg once or twice per day  Diarrhea:  Let's get stool tests  to make sure nothing infectious is causing this  If severe diarrhea or bloody stool, please go to ER  Heart:  I'll discuss with cardiology team  Home health:  OK to refuse services!   Worst case scenario (just to make sure I understand your wishes):  In case of cardiac or respiratory arrest - OK to do CPR and place you on life support  BUT not OK to leave you on life support indefinitely!  Your husband and your daughter French Ana can make decisions for you if you can't   Keep paperwork in the freezer  Give French Ana a copy of the paperwork  Can bring the paperwork to our office so we can keep a copy in your chart  If you change your mind on anything, TELL ME right away!        Visit summary with medication list and pertinent instructions was printed for patient to review. All questions at time of visit were answered - patient instructed to contact office with any additional concerns or updates. ER/RTC precautions were reviewed with the patient.   Note: Total time spent 40 minutes, greater than 50% of the visit was spent face-to-face counseling and coordinating care for the above diagnoses listed in assessment/plan.   Please note: voice recognition software was used to produce this document, and typos may escape review. Please contact Dr. Lyn Hollingshead for any needed clarifications.     Follow-up plan: Return for recheck if needed! .  I advised patient we can leave follow-up a little loose, she is trying to avoid frequent doctor visits.  I advised her to follow-up as directed with her cardiology team, I will be cooperating with them and managing her care.

## 2018-01-18 NOTE — Patient Instructions (Signed)
Plan:  Itching:  Try Benadryl 25 mg as needed every 4-6 hours  Can instead try Claritin 10 mg once or twice per day  Diarrhea:  Let's get stool tests to make sure nothing infectious is causing this  If severe diarrhea or bloody stool, please go to ER  Heart:  I'll discuss with cardiology team  Home health:  OK to refuse services!   Worst case scenario (just to make sure I understand your wishes):  In case of cardiac or respiratory arrest - OK to do CPR and place you on life support  BUT not OK to leave you on life support indefinitely!  Your husband and your daughter French Ana can make decisions for you if you can't   Keep paperwork in the freezer  Give French Ana a copy of the paperwork  Can bring the paperwork to our office so we can keep a copy in your chart  If you change your mind on anything, TELL ME right away!

## 2018-01-20 DIAGNOSIS — I69354 Hemiplegia and hemiparesis following cerebral infarction affecting left non-dominant side: Secondary | ICD-10-CM | POA: Diagnosis not present

## 2018-01-20 DIAGNOSIS — I4811 Longstanding persistent atrial fibrillation: Secondary | ICD-10-CM | POA: Diagnosis not present

## 2018-01-20 DIAGNOSIS — Z95810 Presence of automatic (implantable) cardiac defibrillator: Secondary | ICD-10-CM | POA: Diagnosis not present

## 2018-01-20 DIAGNOSIS — I509 Heart failure, unspecified: Secondary | ICD-10-CM | POA: Diagnosis not present

## 2018-01-20 DIAGNOSIS — L299 Pruritus, unspecified: Secondary | ICD-10-CM | POA: Diagnosis not present

## 2018-01-20 DIAGNOSIS — R197 Diarrhea, unspecified: Secondary | ICD-10-CM | POA: Diagnosis not present

## 2018-01-20 DIAGNOSIS — N184 Chronic kidney disease, stage 4 (severe): Secondary | ICD-10-CM | POA: Diagnosis not present

## 2018-01-20 DIAGNOSIS — I428 Other cardiomyopathies: Secondary | ICD-10-CM | POA: Diagnosis not present

## 2018-01-20 DIAGNOSIS — I130 Hypertensive heart and chronic kidney disease with heart failure and stage 1 through stage 4 chronic kidney disease, or unspecified chronic kidney disease: Secondary | ICD-10-CM | POA: Diagnosis not present

## 2018-01-20 DIAGNOSIS — I2510 Atherosclerotic heart disease of native coronary artery without angina pectoris: Secondary | ICD-10-CM | POA: Diagnosis not present

## 2018-01-20 DIAGNOSIS — I5022 Chronic systolic (congestive) heart failure: Secondary | ICD-10-CM | POA: Diagnosis not present

## 2018-01-20 DIAGNOSIS — N183 Chronic kidney disease, stage 3 (moderate): Secondary | ICD-10-CM | POA: Diagnosis not present

## 2018-01-20 DIAGNOSIS — I639 Cerebral infarction, unspecified: Secondary | ICD-10-CM | POA: Diagnosis not present

## 2018-01-21 LAB — COMPLETE METABOLIC PANEL WITH GFR
AG Ratio: 1.2 (calc) (ref 1.0–2.5)
ALBUMIN MSPROF: 2.8 g/dL — AB (ref 3.6–5.1)
ALKALINE PHOSPHATASE (APISO): 85 U/L (ref 33–130)
ALT: 20 U/L (ref 6–29)
AST: 34 U/L (ref 10–35)
BUN/Creatinine Ratio: 10 (calc) (ref 6–22)
BUN: 13 mg/dL (ref 7–25)
CHLORIDE: 108 mmol/L (ref 98–110)
CO2: 19 mmol/L — AB (ref 20–32)
CREATININE: 1.32 mg/dL — AB (ref 0.60–0.88)
Calcium: 7.8 mg/dL — ABNORMAL LOW (ref 8.6–10.4)
GFR, Est African American: 43 mL/min/{1.73_m2} — ABNORMAL LOW (ref >=60)
GFR, Est Non African American: 37 mL/min/{1.73_m2} — ABNORMAL LOW (ref >=60)
GLUCOSE: 94 mg/dL (ref 65–99)
Globulin: 2.3 g/dL (calc) (ref 1.9–3.7)
Potassium: 3.9 mmol/L (ref 3.5–5.3)
Sodium: 136 mmol/L (ref 135–146)
Total Bilirubin: 0.7 mg/dL (ref 0.2–1.2)
Total Protein: 5.1 g/dL — ABNORMAL LOW (ref 6.1–8.1)

## 2018-01-21 LAB — CBC WITH DIFFERENTIAL/PLATELET
BASOS PCT: 1.7 %
Basophils Absolute: 31 cells/uL (ref 0–200)
EOS PCT: 0 %
Eosinophils Absolute: 0 cells/uL — ABNORMAL LOW (ref 15–500)
HEMATOCRIT: 31 % — AB (ref 35.0–45.0)
Hemoglobin: 9.6 g/dL — ABNORMAL LOW (ref 11.7–15.5)
LYMPHS ABS: 765 {cells}/uL — AB (ref 850–3900)
MCH: 26.4 pg — ABNORMAL LOW (ref 27.0–33.0)
MCHC: 31 g/dL — AB (ref 32.0–36.0)
MCV: 85.2 fL (ref 80.0–100.0)
MPV: 9.1 fL (ref 7.5–12.5)
Monocytes Relative: 32.4 %
NEUTROS ABS: 421 {cells}/uL — AB (ref 1500–7800)
Neutrophils Relative %: 23.4 %
Platelets: 186 10*3/uL (ref 140–400)
RBC: 3.64 10*6/uL — AB (ref 3.80–5.10)
RDW: 15.6 % — AB (ref 11.0–15.0)
Total Lymphocyte: 42.5 %
WBC: 1.8 10*3/uL — AB (ref 3.8–10.8)
WBCMIX: 583 {cells}/uL (ref 200–950)

## 2018-01-21 LAB — LIPASE: LIPASE: 12 U/L (ref 7–60)

## 2018-01-23 DIAGNOSIS — R197 Diarrhea, unspecified: Secondary | ICD-10-CM | POA: Diagnosis not present

## 2018-01-27 ENCOUNTER — Other Ambulatory Visit: Payer: Self-pay | Admitting: Osteopathic Medicine

## 2018-01-27 NOTE — Telephone Encounter (Signed)
Requested medication (s) are due for refill today:no ....... To early  Requested medication (s) are on the active medication list: yes  Last refill:  01/13/18  Future visit schedule no     Requested Prescriptions  Pending Prescriptions Disp Refills   diphenoxylate-atropine (LOMOTIL) 2.5-0.025 MG tablet [Pharmacy Med Name: DIPHENOXYLATE/ATROPINE 2.5MG  TABS] 30 tablet 0    Sig: TAKE 1 TABLET BY MOUTH FOUR TIMES DAILY FOR DIARRHEA OR LOOSE STOOLS     Not Delegated - Gastroenterology:  Antidiarrheals Failed - 01/27/2018  9:02 AM      Failed - This refill cannot be delegated      Failed - Valid encounter within last 12 months    Recent Outpatient Visits          1 week ago Diarrhea, unspecified type   [place] [facility] [place] Sunnie Nielsen, DO   1 month ago Acute loss of vision, bilateral   [place] [facility] [place] Rodolph Bong, MD   1 month ago Fatigue, unspecified type   [place] [facility] [place] Rodolph Bong, MD   5 months ago Chronic systolic congestive heart failure [facility])   [place] [facility] [place] Sunnie Nielsen, DO   11 months ago Chronic atrial fibrillation [facility])    [facility] [place] Sunnie Nielsen, DO

## 2018-01-30 LAB — STOOL CULTURE
MICRO NUMBER:: 91420175
MICRO NUMBER:: 91420176
MICRO NUMBER:: 91420177
SHIGA RESULT: NOT DETECTED
SPECIMEN QUALITY:: ADEQUATE
SPECIMEN QUALITY:: ADEQUATE
SPECIMEN QUALITY:: ADEQUATE

## 2018-01-30 LAB — OVA AND PARASITE EXAMINATION
CONCENTRATE RESULT:: NONE SEEN
SPECIMEN QUALITY:: ADEQUATE
TRICHROME RESULT:: NONE SEEN
VKL: 91419083

## 2018-02-01 ENCOUNTER — Encounter: Payer: Self-pay | Admitting: Osteopathic Medicine

## 2018-02-01 ENCOUNTER — Telehealth: Payer: Self-pay

## 2018-02-01 ENCOUNTER — Ambulatory Visit: Payer: Medicare Other | Admitting: Osteopathic Medicine

## 2018-02-01 ENCOUNTER — Other Ambulatory Visit: Payer: Self-pay

## 2018-02-01 DIAGNOSIS — R197 Diarrhea, unspecified: Secondary | ICD-10-CM

## 2018-02-01 NOTE — Telephone Encounter (Signed)
Drenda Freeze called from [facility] in [place] requesting hospice eval. States pt's daughter reached out requesting services.  Called and gave OK per Dr Lyn Hollingshead.

## 2018-02-01 NOTE — Progress Notes (Signed)
Her daughter will come by to pick up the containers for the C - diff.

## 2018-02-01 NOTE — Progress Notes (Signed)
I have advised family to come in to pick up a stool kit for C-diff. Do you need any other tests. Please advise.

## 2018-02-01 NOTE — Progress Notes (Signed)
Nothing else. The stool studies so far have not showed infection.   The lab did not give them the appropriate specimen collection kit for C-diff or something, Apolonio Schneiders called them today to see what happened, they told us they didn't see the order for the C diff, which I find unacceptable. If needed, family can come here and someone from triage can walk them down to the lab to ensure the lab gives them the appropriate materials.   At any rate, if family can come get a specimen collection kit for C-diff, the order is in place. If there is concern about severe diarrhea or dehydration, they should go to the ER to check blood work and see if she needs IV fluids

## 2018-02-02 ENCOUNTER — Encounter: Payer: Self-pay | Admitting: Osteopathic Medicine

## 2018-02-02 ENCOUNTER — Other Ambulatory Visit: Payer: Self-pay

## 2018-02-03 DIAGNOSIS — H52223 Regular astigmatism, bilateral: Secondary | ICD-10-CM | POA: Diagnosis not present

## 2018-02-03 DIAGNOSIS — R197 Diarrhea, unspecified: Secondary | ICD-10-CM | POA: Diagnosis not present

## 2018-02-03 DIAGNOSIS — H2512 Age-related nuclear cataract, left eye: Secondary | ICD-10-CM | POA: Diagnosis not present

## 2018-02-03 DIAGNOSIS — H2511 Age-related nuclear cataract, right eye: Secondary | ICD-10-CM | POA: Diagnosis not present

## 2018-02-03 DIAGNOSIS — Z961 Presence of intraocular lens: Secondary | ICD-10-CM | POA: Diagnosis not present

## 2018-02-03 MED ORDER — APIXABAN 5 MG PO TABS: 5.0000 mg | ORAL_TABLET | Freq: Two times a day (BID) | ORAL | 3 refills | Status: AC

## 2018-02-06 LAB — FECAL LACTOFERRIN, QUANT
Fecal Lactoferrin: NEGATIVE
MICRO NUMBER: 91463897
SPECIMEN QUALITY:: ADEQUATE

## 2018-02-06 LAB — C. DIFFICILE GDH AND TOXIN A/B
GDH ANTIGEN: NOT DETECTED
MICRO NUMBER:: 91463896
SPECIMEN QUALITY: ADEQUATE
TOXIN A AND B: NOT DETECTED

## 2018-02-06 NOTE — Addendum Note (Signed)
Addended by: Deirdre Pippins on: 02/06/2018 08:11 AM   Modules accepted: Orders

## 2018-02-06 NOTE — Addendum Note (Signed)
Addended by: Deirdre Pippins on: 02/06/2018 01:44 PM   Modules accepted: Orders

## 2018-02-09 ENCOUNTER — Telehealth: Payer: Self-pay

## 2018-02-09 DIAGNOSIS — I420 Dilated cardiomyopathy: Secondary | ICD-10-CM | POA: Diagnosis not present

## 2018-02-09 DIAGNOSIS — I255 Ischemic cardiomyopathy: Secondary | ICD-10-CM | POA: Diagnosis not present

## 2018-02-09 DIAGNOSIS — Z4502 Encounter for adjustment and management of automatic implantable cardiac defibrillator: Secondary | ICD-10-CM | POA: Diagnosis not present

## 2018-02-09 DIAGNOSIS — Z45010 Encounter for checking and testing of cardiac pacemaker pulse generator [battery]: Secondary | ICD-10-CM | POA: Diagnosis not present

## 2018-02-09 DIAGNOSIS — I482 Chronic atrial fibrillation, unspecified: Secondary | ICD-10-CM | POA: Diagnosis not present

## 2018-02-09 DIAGNOSIS — Z7901 Long term (current) use of anticoagulants: Secondary | ICD-10-CM | POA: Diagnosis not present

## 2018-02-09 DIAGNOSIS — I428 Other cardiomyopathies: Secondary | ICD-10-CM | POA: Diagnosis not present

## 2018-02-09 DIAGNOSIS — I5022 Chronic systolic (congestive) heart failure: Secondary | ICD-10-CM | POA: Diagnosis not present

## 2018-02-09 DIAGNOSIS — Z01810 Encounter for preprocedural cardiovascular examination: Secondary | ICD-10-CM | POA: Diagnosis not present

## 2018-02-09 DIAGNOSIS — Z9889 Other specified postprocedural states: Secondary | ICD-10-CM | POA: Diagnosis not present

## 2018-02-09 NOTE — Telephone Encounter (Signed)
Contacted Aggie Cosier - informed of provider's note. She will contact pt's family to make an follow up appt with provider. No other inquiries during call.

## 2018-02-09 NOTE — Telephone Encounter (Signed)
Can let them know thanks for the alert, this is a known issue - patient has not been in to follow up on labs or get CT to evaluate for diarrhea. She needs to follow up in the office!

## 2018-02-09 NOTE — Telephone Encounter (Signed)
Lindsey Reynolds from [facility] called stating that pt is in the process of having her pace maker exchange. As per Lindsey Reynolds, pt's potassium level was at 3 & pt has had diarrhea x 1 mth. Requesting provider place a lab order to check for hyperkalemia. Pt currently on potassium 40 meq/twice daily. She can be contacted at [phone] for add'l inquiries.

## 2018-02-10 ENCOUNTER — Other Ambulatory Visit: Payer: Self-pay

## 2018-02-10 DIAGNOSIS — K551 Chronic vascular disorders of intestine: Secondary | ICD-10-CM

## 2018-02-13 ENCOUNTER — Telehealth: Payer: Self-pay

## 2018-02-13 ENCOUNTER — Ambulatory Visit (INDEPENDENT_AMBULATORY_CARE_PROVIDER_SITE_OTHER): Payer: Medicare Other

## 2018-02-13 DIAGNOSIS — K551 Chronic vascular disorders of intestine: Secondary | ICD-10-CM

## 2018-02-13 DIAGNOSIS — I10 Essential (primary) hypertension: Secondary | ICD-10-CM

## 2018-02-13 DIAGNOSIS — K5730 Diverticulosis of large intestine without perforation or abscess without bleeding: Secondary | ICD-10-CM | POA: Diagnosis not present

## 2018-02-13 DIAGNOSIS — I5022 Chronic systolic (congestive) heart failure: Secondary | ICD-10-CM

## 2018-02-13 NOTE — Telephone Encounter (Signed)
Fayrene Fearing called stating that pt is scheduled for a CT Scan at 11 am today. Requesting for a lab order to check on potassium levels. As per Fayrene Fearing, pt is taking a new med given by cardiologist, but did not specify what med it was. Pls advise, thanks.

## 2018-02-13 NOTE — Telephone Encounter (Signed)
Orders in 

## 2018-02-13 NOTE — Telephone Encounter (Signed)
Pt's husband has been updated. He will see when pt is capable of traveling again to have lab work completed. No other inquiries during call.

## 2018-03-02 ENCOUNTER — Other Ambulatory Visit: Payer: Self-pay | Admitting: Osteopathic Medicine

## 2018-03-02 ENCOUNTER — Ambulatory Visit: Payer: Medicare Other | Admitting: Osteopathic Medicine

## 2018-03-06 DIAGNOSIS — D649 Anemia, unspecified: Secondary | ICD-10-CM | POA: Diagnosis not present

## 2018-03-06 DIAGNOSIS — I639 Cerebral infarction, unspecified: Secondary | ICD-10-CM | POA: Diagnosis not present

## 2018-03-06 DIAGNOSIS — R1312 Dysphagia, oropharyngeal phase: Secondary | ICD-10-CM | POA: Diagnosis not present

## 2018-03-06 DIAGNOSIS — I21A1 Myocardial infarction type 2: Secondary | ICD-10-CM | POA: Diagnosis not present

## 2018-03-06 DIAGNOSIS — I6523 Occlusion and stenosis of bilateral carotid arteries: Secondary | ICD-10-CM | POA: Diagnosis not present

## 2018-03-06 DIAGNOSIS — Z66 Do not resuscitate: Secondary | ICD-10-CM | POA: Diagnosis not present

## 2018-03-06 DIAGNOSIS — E1165 Type 2 diabetes mellitus with hyperglycemia: Secondary | ICD-10-CM | POA: Diagnosis not present

## 2018-03-06 DIAGNOSIS — G9340 Encephalopathy, unspecified: Secondary | ICD-10-CM | POA: Diagnosis not present

## 2018-03-06 DIAGNOSIS — R414 Neurologic neglect syndrome: Secondary | ICD-10-CM | POA: Diagnosis not present

## 2018-03-06 DIAGNOSIS — N179 Acute kidney failure, unspecified: Secondary | ICD-10-CM | POA: Diagnosis not present

## 2018-03-06 DIAGNOSIS — I69354 Hemiplegia and hemiparesis following cerebral infarction affecting left non-dominant side: Secondary | ICD-10-CM | POA: Diagnosis not present

## 2018-03-06 DIAGNOSIS — R4781 Slurred speech: Secondary | ICD-10-CM | POA: Diagnosis not present

## 2018-03-06 DIAGNOSIS — R4702 Dysphasia: Secondary | ICD-10-CM | POA: Diagnosis not present

## 2018-03-06 DIAGNOSIS — R29810 Facial weakness: Secondary | ICD-10-CM | POA: Diagnosis not present

## 2018-03-06 DIAGNOSIS — I6602 Occlusion and stenosis of left middle cerebral artery: Secondary | ICD-10-CM | POA: Diagnosis not present

## 2018-03-06 DIAGNOSIS — N170 Acute kidney failure with tubular necrosis: Secondary | ICD-10-CM | POA: Diagnosis not present

## 2018-03-06 DIAGNOSIS — I63531 Cerebral infarction due to unspecified occlusion or stenosis of right posterior cerebral artery: Secondary | ICD-10-CM | POA: Diagnosis not present

## 2018-03-06 DIAGNOSIS — Z515 Encounter for palliative care: Secondary | ICD-10-CM | POA: Diagnosis not present

## 2018-03-06 DIAGNOSIS — R110 Nausea: Secondary | ICD-10-CM | POA: Diagnosis not present

## 2018-03-07 DIAGNOSIS — R569 Unspecified convulsions: Secondary | ICD-10-CM | POA: Diagnosis not present

## 2018-03-07 DIAGNOSIS — R0602 Shortness of breath: Secondary | ICD-10-CM | POA: Diagnosis not present

## 2018-03-07 DIAGNOSIS — G9349 Other encephalopathy: Secondary | ICD-10-CM | POA: Diagnosis present

## 2018-03-07 DIAGNOSIS — J9601 Acute respiratory failure with hypoxia: Secondary | ICD-10-CM | POA: Diagnosis not present

## 2018-03-07 DIAGNOSIS — I428 Other cardiomyopathies: Secondary | ICD-10-CM | POA: Diagnosis present

## 2018-03-07 DIAGNOSIS — Z66 Do not resuscitate: Secondary | ICD-10-CM | POA: Diagnosis not present

## 2018-03-07 DIAGNOSIS — I639 Cerebral infarction, unspecified: Secondary | ICD-10-CM | POA: Diagnosis not present

## 2018-03-07 DIAGNOSIS — Z515 Encounter for palliative care: Secondary | ICD-10-CM | POA: Diagnosis not present

## 2018-03-07 DIAGNOSIS — N179 Acute kidney failure, unspecified: Secondary | ICD-10-CM | POA: Diagnosis not present

## 2018-03-07 DIAGNOSIS — R4702 Dysphasia: Secondary | ICD-10-CM | POA: Diagnosis present

## 2018-03-07 DIAGNOSIS — Z8673 Personal history of transient ischemic attack (TIA), and cerebral infarction without residual deficits: Secondary | ICD-10-CM | POA: Diagnosis not present

## 2018-03-07 DIAGNOSIS — Z95810 Presence of automatic (implantable) cardiac defibrillator: Secondary | ICD-10-CM | POA: Diagnosis not present

## 2018-03-07 DIAGNOSIS — R531 Weakness: Secondary | ICD-10-CM | POA: Diagnosis not present

## 2018-03-07 DIAGNOSIS — R414 Neurologic neglect syndrome: Secondary | ICD-10-CM | POA: Diagnosis present

## 2018-03-07 DIAGNOSIS — E161 Other hypoglycemia: Secondary | ICD-10-CM | POA: Diagnosis not present

## 2018-03-07 DIAGNOSIS — I4891 Unspecified atrial fibrillation: Secondary | ICD-10-CM | POA: Diagnosis present

## 2018-03-07 DIAGNOSIS — I69391 Dysphagia following cerebral infarction: Secondary | ICD-10-CM | POA: Diagnosis not present

## 2018-03-07 DIAGNOSIS — I472 Ventricular tachycardia: Secondary | ICD-10-CM | POA: Diagnosis not present

## 2018-03-07 DIAGNOSIS — I442 Atrioventricular block, complete: Secondary | ICD-10-CM | POA: Diagnosis not present

## 2018-03-07 DIAGNOSIS — I429 Cardiomyopathy, unspecified: Secondary | ICD-10-CM | POA: Diagnosis not present

## 2018-03-07 DIAGNOSIS — R739 Hyperglycemia, unspecified: Secondary | ICD-10-CM | POA: Diagnosis present

## 2018-03-07 DIAGNOSIS — I10 Essential (primary) hypertension: Secondary | ICD-10-CM | POA: Diagnosis not present

## 2018-03-07 DIAGNOSIS — G40909 Epilepsy, unspecified, not intractable, without status epilepticus: Secondary | ICD-10-CM | POA: Diagnosis not present

## 2018-03-07 DIAGNOSIS — I130 Hypertensive heart and chronic kidney disease with heart failure and stage 1 through stage 4 chronic kidney disease, or unspecified chronic kidney disease: Secondary | ICD-10-CM | POA: Diagnosis present

## 2018-03-07 DIAGNOSIS — D72829 Elevated white blood cell count, unspecified: Secondary | ICD-10-CM | POA: Diagnosis not present

## 2018-03-07 DIAGNOSIS — R918 Other nonspecific abnormal finding of lung field: Secondary | ICD-10-CM | POA: Diagnosis not present

## 2018-03-07 DIAGNOSIS — I69354 Hemiplegia and hemiparesis following cerebral infarction affecting left non-dominant side: Secondary | ICD-10-CM | POA: Diagnosis not present

## 2018-03-07 DIAGNOSIS — G9340 Encephalopathy, unspecified: Secondary | ICD-10-CM | POA: Diagnosis not present

## 2018-03-07 DIAGNOSIS — N170 Acute kidney failure with tubular necrosis: Secondary | ICD-10-CM | POA: Diagnosis not present

## 2018-03-07 DIAGNOSIS — I340 Nonrheumatic mitral (valve) insufficiency: Secondary | ICD-10-CM | POA: Diagnosis not present

## 2018-03-07 DIAGNOSIS — I471 Supraventricular tachycardia: Secondary | ICD-10-CM | POA: Diagnosis not present

## 2018-03-07 DIAGNOSIS — I69314 Frontal lobe and executive function deficit following cerebral infarction: Secondary | ICD-10-CM | POA: Diagnosis not present

## 2018-03-07 DIAGNOSIS — I484 Atypical atrial flutter: Secondary | ICD-10-CM | POA: Diagnosis not present

## 2018-03-07 DIAGNOSIS — E872 Acidosis: Secondary | ICD-10-CM | POA: Diagnosis not present

## 2018-03-07 DIAGNOSIS — N189 Chronic kidney disease, unspecified: Secondary | ICD-10-CM | POA: Diagnosis not present

## 2018-03-07 DIAGNOSIS — I501 Left ventricular failure: Secondary | ICD-10-CM | POA: Diagnosis not present

## 2018-03-07 DIAGNOSIS — N183 Chronic kidney disease, stage 3 (moderate): Secondary | ICD-10-CM | POA: Diagnosis not present

## 2018-03-07 DIAGNOSIS — I63531 Cerebral infarction due to unspecified occlusion or stenosis of right posterior cerebral artery: Secondary | ICD-10-CM | POA: Diagnosis present

## 2018-03-07 DIAGNOSIS — D500 Iron deficiency anemia secondary to blood loss (chronic): Secondary | ICD-10-CM | POA: Diagnosis not present

## 2018-03-07 DIAGNOSIS — D6489 Other specified anemias: Secondary | ICD-10-CM | POA: Diagnosis present

## 2018-03-07 DIAGNOSIS — R7989 Other specified abnormal findings of blood chemistry: Secondary | ICD-10-CM | POA: Diagnosis not present

## 2018-03-07 DIAGNOSIS — I82B12 Acute embolism and thrombosis of left subclavian vein: Secondary | ICD-10-CM | POA: Diagnosis present

## 2018-03-07 DIAGNOSIS — R9431 Abnormal electrocardiogram [ECG] [EKG]: Secondary | ICD-10-CM | POA: Diagnosis not present

## 2018-03-07 DIAGNOSIS — I214 Non-ST elevation (NSTEMI) myocardial infarction: Secondary | ICD-10-CM | POA: Diagnosis not present

## 2018-03-07 DIAGNOSIS — R4701 Aphasia: Secondary | ICD-10-CM | POA: Diagnosis present

## 2018-03-07 DIAGNOSIS — I5022 Chronic systolic (congestive) heart failure: Secondary | ICD-10-CM | POA: Diagnosis present

## 2018-03-07 DIAGNOSIS — J90 Pleural effusion, not elsewhere classified: Secondary | ICD-10-CM | POA: Diagnosis not present

## 2018-03-07 DIAGNOSIS — Z7901 Long term (current) use of anticoagulants: Secondary | ICD-10-CM | POA: Diagnosis not present

## 2018-03-07 DIAGNOSIS — I129 Hypertensive chronic kidney disease with stage 1 through stage 4 chronic kidney disease, or unspecified chronic kidney disease: Secondary | ICD-10-CM | POA: Diagnosis not present

## 2018-03-07 DIAGNOSIS — R4020 Unspecified coma: Secondary | ICD-10-CM | POA: Diagnosis not present

## 2018-03-07 DIAGNOSIS — Z9889 Other specified postprocedural states: Secondary | ICD-10-CM | POA: Diagnosis not present

## 2018-03-07 DIAGNOSIS — I21A1 Myocardial infarction type 2: Secondary | ICD-10-CM | POA: Diagnosis present

## 2018-03-07 DIAGNOSIS — I341 Nonrheumatic mitral (valve) prolapse: Secondary | ICD-10-CM | POA: Diagnosis not present

## 2018-03-07 DIAGNOSIS — R29810 Facial weakness: Secondary | ICD-10-CM | POA: Diagnosis present

## 2018-03-07 DIAGNOSIS — I447 Left bundle-branch block, unspecified: Secondary | ICD-10-CM | POA: Diagnosis not present

## 2018-03-07 DIAGNOSIS — E162 Hypoglycemia, unspecified: Secondary | ICD-10-CM | POA: Diagnosis not present

## 2018-03-10 ENCOUNTER — Telehealth: Payer: Self-pay

## 2018-03-10 NOTE — Telephone Encounter (Signed)
Pt's husband called to notify provider that pt passed away on 2018-03-15. Time of death was 5:41 pm at [facility].

## 2018-04-01 DEATH — deceased

## 2018-09-29 ENCOUNTER — Other Ambulatory Visit: Payer: Self-pay

## 2019-05-13 IMAGING — CT CT HEAD W/O CM
3 series · 15 of 47 positions shown, 18 images · non-contrast
Comparison: None.

CLINICAL DATA: Vision loss

EXAM:
CT HEAD WITHOUT CONTRAST
TECHNIQUE: Contiguous axial images were obtained from the base of the skull
through the vertex without intravenous contrast.

[Series 2: head wo · axial · 0.39mm/px · z∈[-140,-15]mm · 9 of 30 slices shown, 12 images]
[im 3/30  brain]
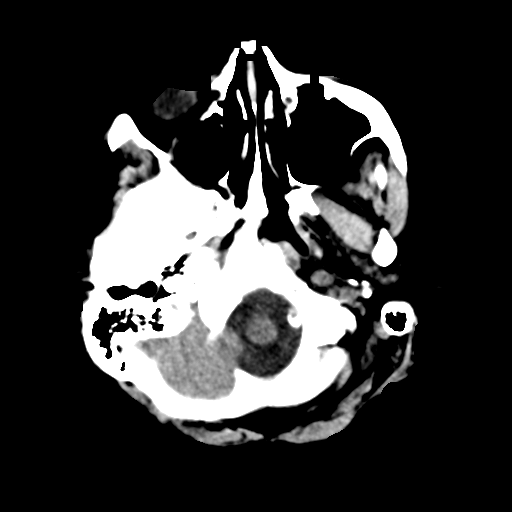
[im 3/30  bone]
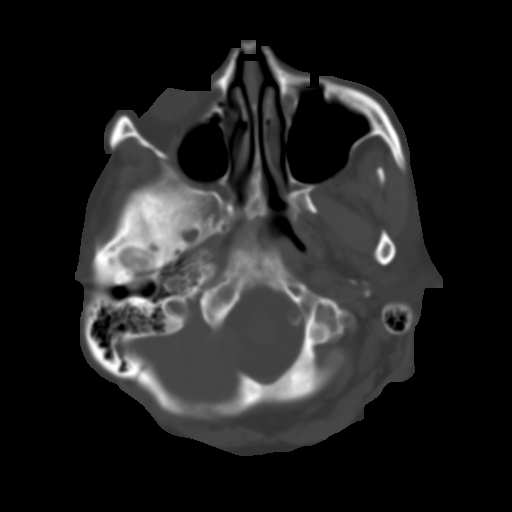
[im 6/30  brain]
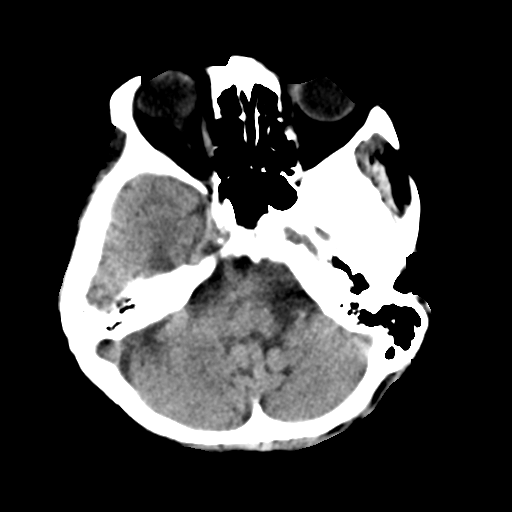
[im 9/30  brain]
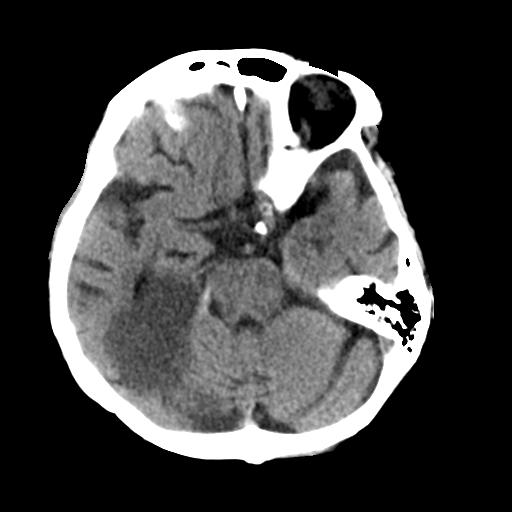
[im 12/30  brain]
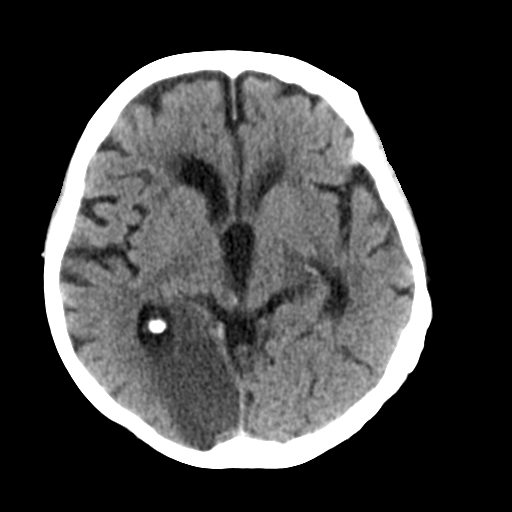
[im 16/30  brain]
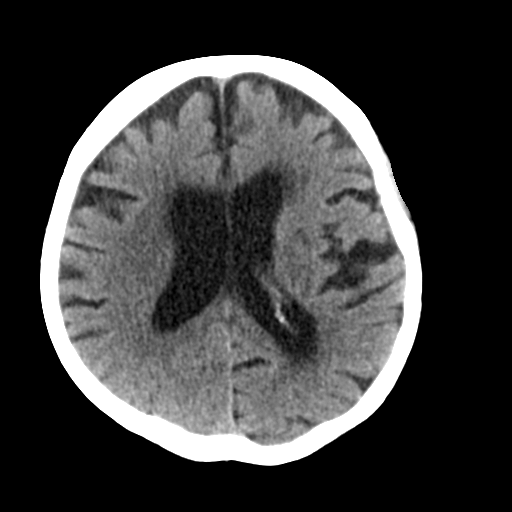
[im 16/30  bone]
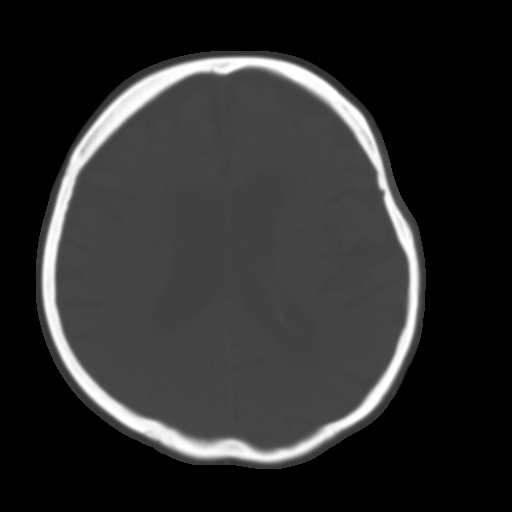
[im 19/30  brain]
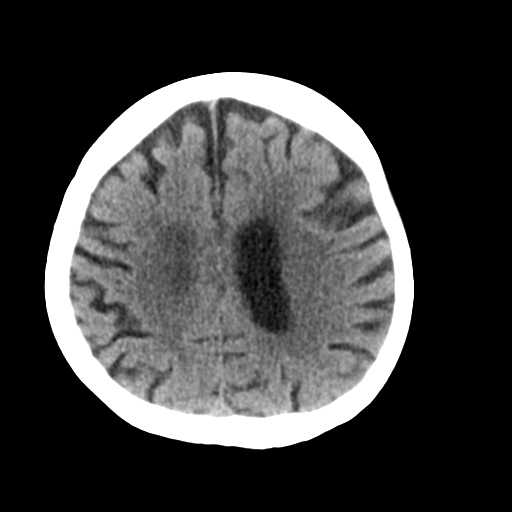
[im 22/30  brain]
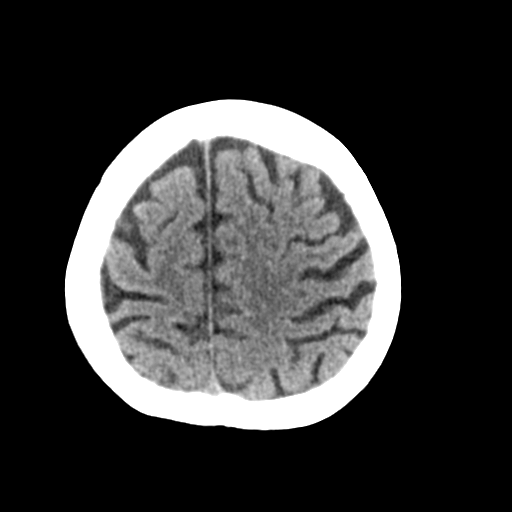
[im 25/30  brain]
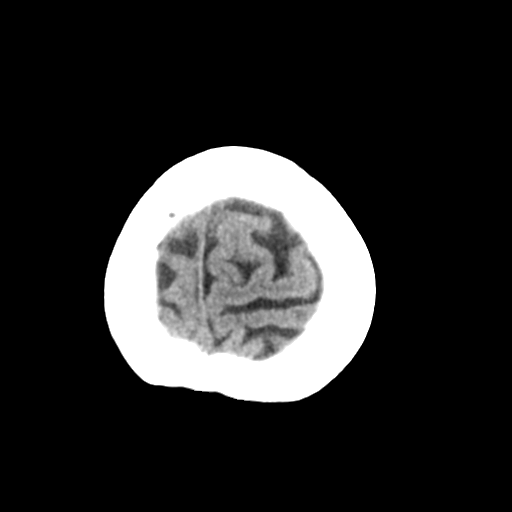
[im 28/30  brain]
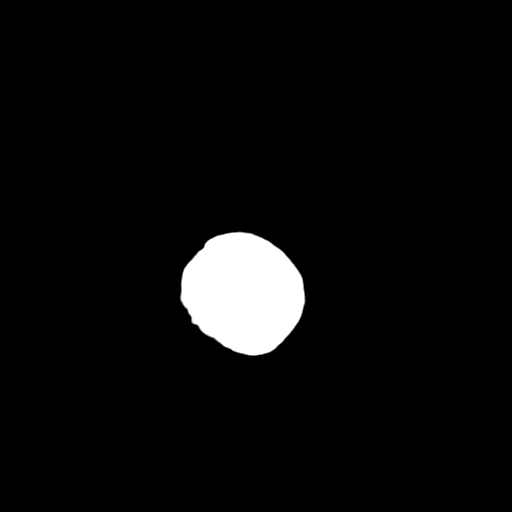
[im 28/30  bone]
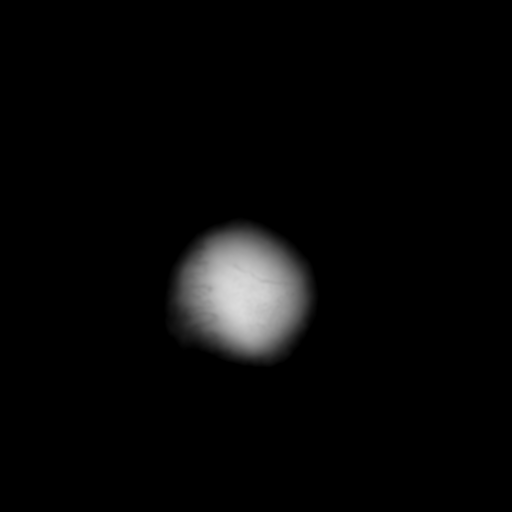

[Series 4: head wo coronal · coronal · 0.29mm/px · 3 of 59 slices shown]
[im 20/59  brain]
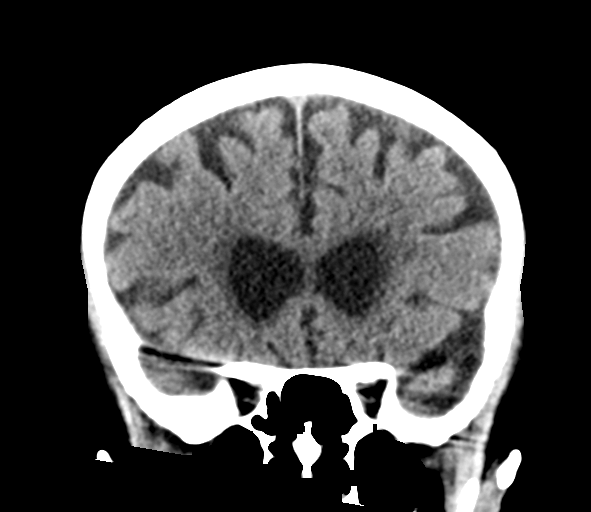
[im 26/59  brain]
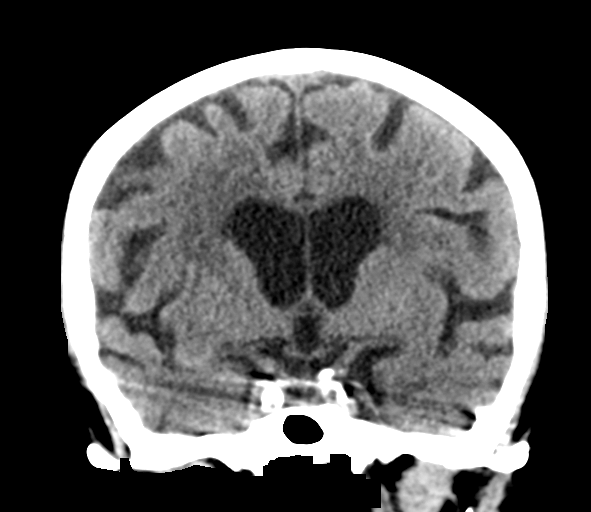
[im 33/59  brain]
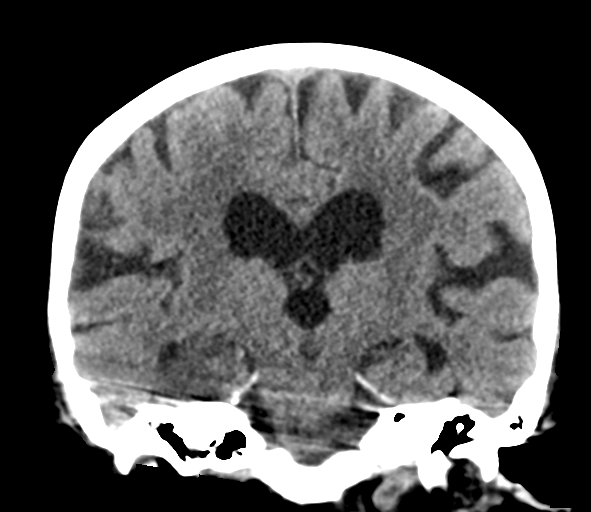

[Series 5: head wo sagittal · sagittal · 0.28mm/px · 3 of 54 slices shown]
[im 21/54  brain]
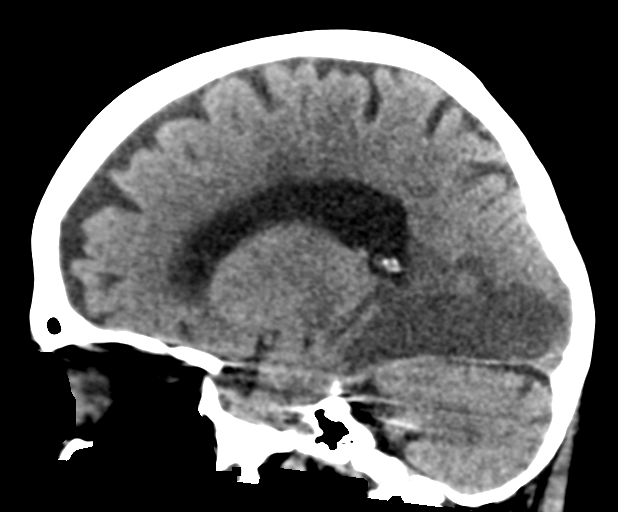
[im 27/54  brain]
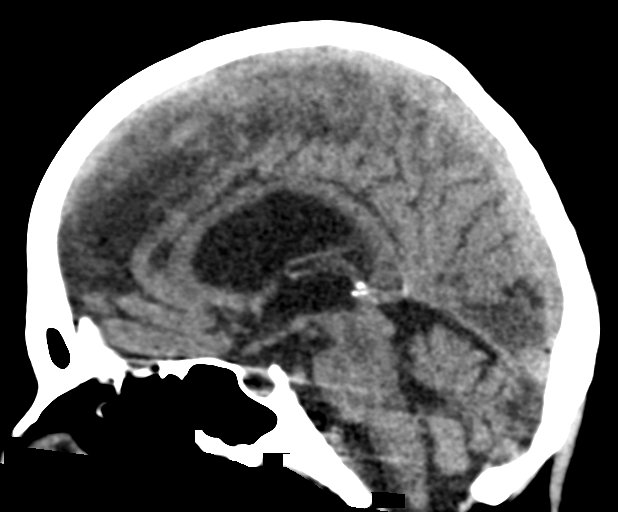
[im 34/54  brain]
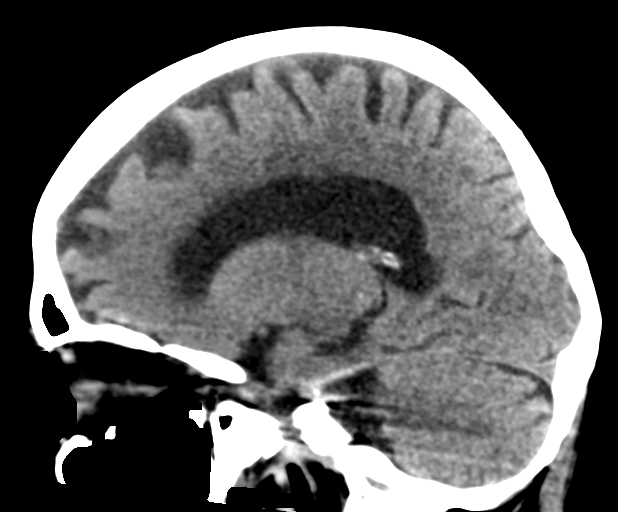

[15 of 47 positions shown; findings below may reference images not displayed]

FINDINGS: Brain: There is moderate diffuse atrophy. There is no appreciable
mass. No hemorrhage, extra-axial fluid collection, or midline shift.

There is cytotoxic edema throughout the medial aspect of the right
occipital lobe with extension inferiorly and somewhat laterally in
the right occipital lobe. There is also decreased attenuation in the
posterior most aspect of the right temporal lobe. This appearance is
consistent with an acute appearing branch infarct involving the
posterior cerebral artery on the right. Elsewhere, there is patchy
periventricular small vessel disease.

Vascular: No hyperdense vessel evident. There is calcification in
each distal vertebral artery and carotid siphon region.

Skull: Bony calvarium appears intact.

Sinuses/Orbits: There is mucosal thickening in several ethmoid air
cells bilaterally. Other visualized paranasal sinuses are clear.
Orbits appear symmetric bilaterally with evidence of previous
cataract removals bilaterally.

Other: Visualized mastoid air cells are clear.
IMPRESSION: 1. Acute appearing infarct involving portions of the right occipital
and posterior right temporal lobe consistent with an acute infarct
involving portions of the right posterior cerebral artery. There is
cytotoxic edema in this area of apparent acute infarct.

2.  Atrophy with patchy periventricular small vessel disease.

3.  No well-defined mass.  No hemorrhage.

4.  There are foci of arterial vascular calcification.

5.  There is mucosal thickening in several ethmoid air cells.
